# Patient Record
Sex: Female | Born: 1980 | Race: Black or African American | Hispanic: No | Marital: Single | Smoking: Current some day smoker
Health system: Southern US, Community
[De-identification: ages and names within clinical notes are randomized; demographics above are authoritative.]

## PROBLEM LIST (undated history)

## (undated) DIAGNOSIS — I10 Essential (primary) hypertension: Secondary | ICD-10-CM

## (undated) DIAGNOSIS — R87629 Unspecified abnormal cytological findings in specimens from vagina: Secondary | ICD-10-CM

## (undated) DIAGNOSIS — E669 Obesity, unspecified: Secondary | ICD-10-CM

## (undated) DIAGNOSIS — A599 Trichomoniasis, unspecified: Secondary | ICD-10-CM

## (undated) HISTORY — DX: Unspecified abnormal cytological findings in specimens from vagina: R87.629

## (undated) HISTORY — DX: Essential (primary) hypertension: I10

## (undated) HISTORY — PX: NO PAST SURGERIES: SHX2092

---

## 2017-06-28 ENCOUNTER — Encounter: Payer: Self-pay | Admitting: *Deleted

## 2017-07-11 ENCOUNTER — Encounter: Payer: Self-pay | Admitting: Family Medicine

## 2017-07-11 ENCOUNTER — Ambulatory Visit (INDEPENDENT_AMBULATORY_CARE_PROVIDER_SITE_OTHER): Payer: Self-pay | Admitting: Family Medicine

## 2017-07-11 VITALS — BP 156/95 | HR 82 | Ht 68.0 in | Wt 292.7 lb

## 2017-07-11 DIAGNOSIS — O10919 Unspecified pre-existing hypertension complicating pregnancy, unspecified trimester: Secondary | ICD-10-CM | POA: Insufficient documentation

## 2017-07-11 DIAGNOSIS — Z113 Encounter for screening for infections with a predominantly sexual mode of transmission: Secondary | ICD-10-CM

## 2017-07-11 DIAGNOSIS — O10912 Unspecified pre-existing hypertension complicating pregnancy, second trimester: Secondary | ICD-10-CM

## 2017-07-11 DIAGNOSIS — Z3480 Encounter for supervision of other normal pregnancy, unspecified trimester: Secondary | ICD-10-CM

## 2017-07-11 DIAGNOSIS — O0990 Supervision of high risk pregnancy, unspecified, unspecified trimester: Secondary | ICD-10-CM | POA: Insufficient documentation

## 2017-07-11 DIAGNOSIS — Z3482 Encounter for supervision of other normal pregnancy, second trimester: Secondary | ICD-10-CM

## 2017-07-11 DIAGNOSIS — O09522 Supervision of elderly multigravida, second trimester: Secondary | ICD-10-CM

## 2017-07-11 DIAGNOSIS — F329 Major depressive disorder, single episode, unspecified: Secondary | ICD-10-CM

## 2017-07-11 DIAGNOSIS — O09529 Supervision of elderly multigravida, unspecified trimester: Secondary | ICD-10-CM | POA: Insufficient documentation

## 2017-07-11 DIAGNOSIS — Z124 Encounter for screening for malignant neoplasm of cervix: Secondary | ICD-10-CM

## 2017-07-11 DIAGNOSIS — F32A Depression, unspecified: Secondary | ICD-10-CM

## 2017-07-11 DIAGNOSIS — Z1151 Encounter for screening for human papillomavirus (HPV): Secondary | ICD-10-CM

## 2017-07-11 LAB — POCT URINALYSIS DIP (DEVICE)
Glucose, UA: NEGATIVE mg/dL
Hgb urine dipstick: NEGATIVE
Nitrite: NEGATIVE
Protein, ur: 30 mg/dL — AB
Specific Gravity, Urine: >=1.03 (ref 1.005–1.030)
Urobilinogen, UA: 0.2 mg/dL (ref 0.0–1.0)
pH: 6 (ref 5.0–8.0)

## 2017-07-11 MED ORDER — ASPIRIN 81 MG PO TABS: 81.0000 mg | ORAL_TABLET | Freq: Every day | ORAL | 6 refills | Status: DC

## 2017-07-11 NOTE — Progress Notes (Signed)
  Subjective:    Felicia HarrisJennifer Brennan is a G2P0010 7443w0d being seen today for her first obstetrical visit.  Her obstetrical history is significant for advanced maternal age and chronic HTN. Patient does intend to breast feed. Pregnancy history fully reviewed.  Patient reports no complaints.  Vitals:   07/11/17 0849 07/11/17 0852  BP: (!) 156/95   Pulse: 82   Weight: 292 lb 11.2 oz (132.8 kg)   Height:  5\' 8"  (1.727 m)    HISTORY: OB History  Gravida Para Term Preterm AB Living  2       1    SAB TAB Ectopic Multiple Live Births  1            # Outcome Date GA Lbr Len/2nd Weight Sex Delivery Anes PTL Lv  2 Current           1 SAB 07/16/12       N      Past Medical History:  Diagnosis Date  . Hypertension    History reviewed. No pertinent surgical history. Family History  Problem Relation Age of Onset  . Hypertension Mother   . Pancreatic cancer Mother   . COPD Father      Exam   Physical Exam  Constitutional: She is oriented to person, place, and time. She appears well-developed and well-nourished.  HENT:  Head: Normocephalic and atraumatic.  Eyes: EOM are normal. Pupils are equal, round, and reactive to light.  Neck: Normal range of motion.  Cardiovascular: Normal rate and intact distal pulses.  Pulmonary/Chest: Effort normal. No respiratory distress.  Abdominal: Soft. There is no tenderness.  Musculoskeletal: Normal range of motion. She exhibits no edema.  Neurological: She is alert and oriented to person, place, and time.  Skin: Skin is warm and dry.  Psychiatric: She has a normal mood and affect. Her behavior is normal.      Assessment:    Pregnancy: G2P0010 Patient Active Problem List   Diagnosis Date Noted  . Supervision of other normal pregnancy, antepartum 07/11/2017  . Chronic hypertension during pregnancy, antepartum 07/11/2017  . Antepartum multigravida of advanced maternal age 35/26/2018        Plan:     Initial labs drawn. Add pr/cr  ratio and CMP for baseline labs for chronic HTN. Prenatal vitamins. Problem list reviewed and updated. Genetic Screening discussed Quad Screen: ordered.  Ultrasound discussed; fetal survey: ordered.  Follow up in 4 weeks.   [employer]mber Devika Dragovich 07/11/2017

## 2017-07-11 NOTE — Addendum Note (Signed)
Addended by: Lorelle GibbsWILSON, CHIQUITA L on: 07/11/2017 03:03 PM   Modules accepted: Orders

## 2017-07-11 NOTE — Addendum Note (Signed)
Addended by: Mikey BussingWILSON, CHIQUITA L on: 07/11/2017 09:16 AM   Modules accepted: Orders

## 2017-07-11 NOTE — Patient Instructions (Signed)
Eating Plan for Pregnant Women While you are pregnant, your body will require additional nutrition to help support your growing baby. It is recommended that you consume:  150 additional calories each day during your first trimester.  300 additional calories each day during your second trimester.  300 additional calories each day during your third trimester.  Eating a healthy, well-balanced diet is very important for your health and for your baby's health. You also have a higher need for some vitamins and minerals, such as folic acid, calcium, iron, and vitamin D. What do I need to know about eating during pregnancy?  Do not try to lose weight or go on a diet during pregnancy.  Choose healthy, nutritious foods. Choose  of a sandwich with a glass of milk instead of a candy bar or a high-calorie sugar-sweetened beverage.  Limit your overall intake of foods that have "empty calories." These are foods that have little nutritional value, such as sweets, desserts, candies, sugar-sweetened beverages, and fried foods.  Eat a variety of foods, especially fruits and vegetables.  Take a prenatal vitamin to help meet the additional needs during pregnancy, specifically for folic acid, iron, calcium, and vitamin D.  Remember to stay active. Ask your health care provider for exercise recommendations that are specific to you.  Practice good food safety and cleanliness, such as washing your hands before you eat and after you prepare raw meat. This helps to prevent foodborne illnesses, such as listeriosis, that can be very dangerous for your baby. Ask your health care provider for more information about listeriosis. What does 150 extra calories look like? Healthy options for an additional 150 calories each day could be any of the following:  Plain low-fat yogurt (6-8 oz) with  cup of berries.  1 apple with 2 teaspoons of peanut butter.  Cut-up vegetables with  cup of hummus.  Low-fat chocolate  milk (8 oz or 1 cup).  1 string cheese with 1 medium orange.   of a peanut butter and jelly sandwich on whole-wheat bread (1 tsp of peanut butter).  For 300 calories, you could eat two of those healthy options each day. What is a healthy amount of weight to gain? The recommended amount of weight for you to gain is based on your pre-pregnancy BMI. If your pre-pregnancy BMI was:  Less than 18 (underweight), you should gain 28-40 lb.  18-24.9 (normal), you should gain 25-35 lb.  25-29.9 (overweight), you should gain 15-25 lb.  Greater than 30 (obese), you should gain 11-20 lb.  What if I am having twins or multiples? Generally, pregnant women who will be having twins or multiples may need to increase their daily calories by 300-600 calories each day. The recommended range for total weight gain is 25-54 lb, depending on your pre-pregnancy BMI. Talk with your health care provider for specific guidance about additional nutritional needs, weight gain, and exercise during your pregnancy. What foods can I eat? Grains Any grains. Try to choose whole grains, such as whole-wheat bread, oatmeal, or brown rice. Vegetables Any vegetables. Try to eat a variety of colors and types of vegetables to get a full range of vitamins and minerals. Remember to wash your vegetables well before eating. Fruits Any fruits. Try to eat a variety of colors and types of fruit to get a full range of vitamins and minerals. Remember to wash your fruits well before eating. Meats and Other Protein Sources Lean meats, including chicken, turkey, fish, and lean cuts of beef, veal,   or pork. Make sure that all meats are cooked to "well done." Tofu. Tempeh. Beans. Eggs. Peanut butter and other nut butters. Seafood, such as shrimp, crab, and lobster. If you choose fish, select types that are higher in omega-3 fatty acids, including salmon, herring, mussels, trout, sardines, and pollock. Make sure that all meats are cooked to  food-safe temperatures. Dairy Pasteurized milk and milk alternatives. Pasteurized yogurt and pasteurized cheese. Cottage cheese. Sour cream. Beverages Water. Juices that contain 100% fruit juice or vegetable juice. Caffeine-free teas and decaffeinated coffee. Drinks that contain caffeine are okay to drink, but it is better to avoid caffeine. Keep your total caffeine intake to less than 200 mg each day (12 oz of coffee, tea, or soda) or as directed by your health care provider. Condiments Any pasteurized condiments. Sweets and Desserts Any sweets and desserts. Fats and Oils Any fats and oils. The items listed above may not be a complete list of recommended foods or beverages. Contact your dietitian for more options. What foods are not recommended? Vegetables Unpasteurized (raw) vegetable juices. Fruits Unpasteurized (raw) fruit juices. Meats and Other Protein Sources Cured meats that have nitrates, such as bacon, salami, and hotdogs. Luncheon meats, bologna, or other deli meats (unless they are reheated until they are steaming hot). Refrigerated pate, meat spreads from a meat counter, smoked seafood that is found in the refrigerated section of a store. Raw fish, such as sushi or sashimi. High mercury content fish, such as tilefish, shark, swordfish, and king mackerel. Raw meats, such as tuna or beef tartare. Undercooked meats and poultry. Make sure that all meats are cooked to food-safe temperatures. Dairy Unpasteurized (raw) milk and any foods that have raw milk in them. Soft cheeses, such as feta, queso blanco, queso fresco, Brie, Camembert cheeses, blue-veined cheeses, and Panela cheese (unless it is made with pasteurized milk, which must be stated on the label). Beverages Alcohol. Sugar-sweetened beverages, such as sodas, teas, or energy drinks. Condiments Homemade fermented foods and drinks, such as pickles, sauerkraut, or kombucha drinks. (Store-bought pasteurized versions of these are  okay.) Other Salads that are made in the store, such as ham salad, chicken salad, egg salad, tuna salad, and seafood salad. The items listed above may not be a complete list of foods and beverages to avoid. Contact your dietitian for more information. This information is not intended to replace advice given to you by your health care provider. Make sure you discuss any questions you have with your health care provider. Document Released: 04/17/2014 Document Revised: 12/09/2015 Document Reviewed: 12/16/2013 Elsevier Interactive Patient Education  2018 Elsevier Inc.   

## 2017-07-12 LAB — CYTOLOGY - PAP
CHLAMYDIA, DNA PROBE: NEGATIVE
DIAGNOSIS: NEGATIVE
HPV: DETECTED — AB
Neisseria Gonorrhea: NEGATIVE

## 2017-07-13 LAB — URINE CULTURE, OB REFLEX

## 2017-07-13 LAB — CULTURE, OB URINE

## 2017-07-25 ENCOUNTER — Encounter: Payer: Self-pay | Admitting: Family Medicine

## 2017-07-25 DIAGNOSIS — A5901 Trichomonal vulvovaginitis: Secondary | ICD-10-CM

## 2017-07-25 DIAGNOSIS — R87619 Unspecified abnormal cytological findings in specimens from cervix uteri: Secondary | ICD-10-CM | POA: Insufficient documentation

## 2017-07-25 DIAGNOSIS — O23599 Infection of other part of genital tract in pregnancy, unspecified trimester: Secondary | ICD-10-CM

## 2017-07-25 DIAGNOSIS — Z3480 Encounter for supervision of other normal pregnancy, unspecified trimester: Secondary | ICD-10-CM

## 2017-07-25 MED ORDER — METRONIDAZOLE 500 MG PO TABS
500.0000 mg | ORAL_TABLET | Freq: Two times a day (BID) | ORAL | 0 refills | Status: DC
Start: 2017-07-25 — End: 2017-11-27

## 2017-07-25 NOTE — Progress Notes (Signed)
Reviewing record for practice. Noted several follow up items 1) Trich on pap-- needs treatment, sent in rx and routed message to clinic to call pt. Partner needs treatment as well.  2) abnormal pap-- updated problem list 3) noted elevated BP and sent message to provider regarding starting antihypertensive

## 2017-07-27 ENCOUNTER — Telehealth: Payer: Self-pay | Admitting: *Deleted

## 2017-07-27 DIAGNOSIS — O280 Abnormal hematological finding on antenatal screening of mother: Secondary | ICD-10-CM | POA: Insufficient documentation

## 2017-07-27 NOTE — Telephone Encounter (Addendum)
Called pt to discuss AFP test results and additional appt which has been made. Heard message stating that the mailbox is full and cannot accept messages. **Pt needs to be informed of abnormal AFP results showing increased risk for Down's Syndrome and Trisomy 18. Genetic Counseling appt has been scheduled on same [place] as her anatomy US. She should arrive @ 0830 and plan for appts to last 2-2.5 hrs.  Pt also needs to be informed of +trich, Rx called in to pharmacy and her partner requires treatment. In addition, her Pap is abnormal and she will need Colposcopy post partum.   1/24  1658  Per chart review, pt was seen @ office yesterday by Dr. Earlene Plateravis and was informed of the above information.

## 2017-08-08 ENCOUNTER — Encounter (HOSPITAL_COMMUNITY): Payer: Self-pay | Admitting: Family Medicine

## 2017-08-08 ENCOUNTER — Encounter: Payer: Self-pay | Admitting: Obstetrics and Gynecology

## 2017-08-08 ENCOUNTER — Telehealth: Payer: Self-pay

## 2017-08-08 ENCOUNTER — Ambulatory Visit: Payer: Self-pay | Admitting: Clinical

## 2017-08-08 ENCOUNTER — Ambulatory Visit (INDEPENDENT_AMBULATORY_CARE_PROVIDER_SITE_OTHER): Payer: Self-pay | Admitting: Obstetrics and Gynecology

## 2017-08-08 VITALS — BP 140/76 | HR 80 | Wt 302.9 lb

## 2017-08-08 DIAGNOSIS — O23599 Infection of other part of genital tract in pregnancy, unspecified trimester: Secondary | ICD-10-CM

## 2017-08-08 DIAGNOSIS — O23592 Infection of other part of genital tract in pregnancy, second trimester: Secondary | ICD-10-CM

## 2017-08-08 DIAGNOSIS — O10919 Unspecified pre-existing hypertension complicating pregnancy, unspecified trimester: Secondary | ICD-10-CM

## 2017-08-08 DIAGNOSIS — Z3480 Encounter for supervision of other normal pregnancy, unspecified trimester: Secondary | ICD-10-CM

## 2017-08-08 DIAGNOSIS — A5901 Trichomonal vulvovaginitis: Secondary | ICD-10-CM

## 2017-08-08 DIAGNOSIS — R87619 Unspecified abnormal cytological findings in specimens from cervix uteri: Secondary | ICD-10-CM

## 2017-08-08 DIAGNOSIS — O280 Abnormal hematological finding on antenatal screening of mother: Secondary | ICD-10-CM

## 2017-08-08 DIAGNOSIS — O09529 Supervision of elderly multigravida, unspecified trimester: Secondary | ICD-10-CM

## 2017-08-08 DIAGNOSIS — O10912 Unspecified pre-existing hypertension complicating pregnancy, second trimester: Secondary | ICD-10-CM

## 2017-08-08 DIAGNOSIS — O09522 Supervision of elderly multigravida, second trimester: Secondary | ICD-10-CM

## 2017-08-08 MED ORDER — PRENATAL VITAMINS 0.8 MG PO TABS: 1.0000 | ORAL_TABLET | Freq: Every day | ORAL | 12 refills | Status: DC

## 2017-08-08 NOTE — BH Specialist Note (Signed)
error 

## 2017-08-08 NOTE — Telephone Encounter (Signed)
Clld Pt to advise of AFP results & appts,+Tric,No answer,VM full.

## 2017-08-08 NOTE — Progress Notes (Addendum)
   PRENATAL VISIT NOTE  Subjective:  Felicia Brennan is a 37 y.o. G2P0010 at 4141w0d being seen today for ongoing prenatal care.  She is currently monitored for the following issues for this high-risk pregnancy and has Supervision of other normal pregnancy, antepartum; Chronic hypertension during pregnancy, antepartum; Antepartum multigravida of advanced maternal age; Trichomonal vaginitis in pregnancy; Abnormal Pap smear of cervix; and Abnormal quad screen on their problem list.  Patient reports no complaints.  Contractions: Not present. Vag. Bleeding: None.  Movement: Absent. Denies leaking of fluid.   The following portions of the patient's history were reviewed and updated as appropriate: allergies, current medications, past family history, past medical history, past social history, past surgical history and problem list. Problem list updated.  Objective:   Vitals:   08/08/17 1324  BP: 140/76  Pulse: 80  Weight: (!) 302 lb 14.4 oz (137.4 kg)    Fetal Status: Fetal Heart Rate (bpm): 150   Movement: Absent     General:  Alert, oriented and cooperative. Patient is in no acute distress.  Skin: Skin is warm and dry. No rash noted.   Cardiovascular: Normal heart rate noted  Respiratory: Normal respiratory effort, no problems with respiration noted  Abdomen: Soft, gravid, appropriate for gestational age.  Pain/Pressure: Absent     Pelvic: Cervical exam deferred        Extremities: Normal range of motion.  Edema: None  Mental Status:  Normal mood and affect. Normal behavior. Normal judgment and thought content.   Assessment and Plan:  Pregnancy: G2P0010 at 3041w0d  1. Chronic hypertension during pregnancy, antepartum On meds a long time ago, not recently Stable today cont baby ASA  2. Abnormal cervical Papanicolaou smear, unspecified abnormal pap finding Needs colpo pp  3. Supervision of other normal pregnancy, antepartum Anatomy scheduled for 08/14/17  4. Trichomonal vaginitis  during pregnancy, antepartum Informed of positive test today, script previously sent to pharmacy Emphasized need for partner treatment  5. Abnormal quad screen + for DS & T18 Informed patient today of positive screening test, briefly discussed amniocentesis and NIPS, she declines NIPS today, likely does not want amnio, will consider and discuss at genetic counseling appt Scheduled for genetic counseling same day as anatomy (1/29) Answered all questions [facility], would like to see after her genetic counseling appt, has been scheduled for same  6. Antepartum multigravida of advanced maternal age   Preterm labor symptoms and general obstetric precautions including but not limited to vaginal bleeding, contractions, leaking of fluid and fetal movement were reviewed in detail with the patient. Please refer to After Visit Summary for other counseling recommendations.  Return in about 3 weeks (around 08/29/2017) for OB visit (MD).   Conan BowensKelly M Ted Goodner, MD

## 2017-08-14 ENCOUNTER — Encounter (HOSPITAL_COMMUNITY): Payer: Self-pay

## 2017-08-14 ENCOUNTER — Ambulatory Visit (HOSPITAL_COMMUNITY)
Admission: RE | Admit: 2017-08-14 | Discharge: 2017-08-14 | Disposition: A | Discharge status: Home / Self Care | Payer: PRIVATE HEALTH INSURANCE | Source: Ambulatory Visit | Attending: Family Medicine | Admitting: Family Medicine

## 2017-08-14 ENCOUNTER — Ambulatory Visit: Payer: Self-pay

## 2017-08-14 ENCOUNTER — Other Ambulatory Visit (HOSPITAL_COMMUNITY): Payer: Self-pay | Admitting: *Deleted

## 2017-08-14 ENCOUNTER — Other Ambulatory Visit: Payer: Self-pay | Admitting: Family Medicine

## 2017-08-14 DIAGNOSIS — Z6841 Body Mass Index (BMI) 40.0 and over, adult: Secondary | ICD-10-CM | POA: Insufficient documentation

## 2017-08-14 DIAGNOSIS — O289 Unspecified abnormal findings on antenatal screening of mother: Secondary | ICD-10-CM | POA: Diagnosis present

## 2017-08-14 DIAGNOSIS — O09529 Supervision of elderly multigravida, unspecified trimester: Secondary | ICD-10-CM

## 2017-08-14 DIAGNOSIS — O281 Abnormal biochemical finding on antenatal screening of mother: Secondary | ICD-10-CM

## 2017-08-14 DIAGNOSIS — Z362 Encounter for other antenatal screening follow-up: Secondary | ICD-10-CM

## 2017-08-14 DIAGNOSIS — Z3A18 18 weeks gestation of pregnancy: Secondary | ICD-10-CM | POA: Diagnosis not present

## 2017-08-14 DIAGNOSIS — O10019 Pre-existing essential hypertension complicating pregnancy, unspecified trimester: Secondary | ICD-10-CM | POA: Diagnosis not present

## 2017-08-14 DIAGNOSIS — Z3A19 19 weeks gestation of pregnancy: Secondary | ICD-10-CM

## 2017-08-14 DIAGNOSIS — O99212 Obesity complicating pregnancy, second trimester: Secondary | ICD-10-CM | POA: Diagnosis not present

## 2017-08-14 DIAGNOSIS — O10919 Unspecified pre-existing hypertension complicating pregnancy, unspecified trimester: Secondary | ICD-10-CM

## 2017-08-14 DIAGNOSIS — O09512 Supervision of elderly primigravida, second trimester: Secondary | ICD-10-CM | POA: Insufficient documentation

## 2017-08-14 LAB — AFP TETRA
DIA VALUE (EIA): 244.58 pg/mL
DSR (BY AGE) 1 IN: 217
Gestational Age: 13.6 WEEKS
MSAFP: 9.9 ng/mL
MSHCG: 30601 m[IU]/mL
Maternal Age At EDD: 36.4 yr
Weight: 292 [lb_av]
uE3 Value: 0.14 ng/mL

## 2017-08-14 LAB — COMPREHENSIVE METABOLIC PANEL
A/G RATIO: 1.3 (ref 1.2–2.2)
ALT: 5 IU/L (ref 0–32)
AST: 9 IU/L (ref 0–40)
Albumin: 3.9 g/dL (ref 3.5–5.5)
Alkaline Phosphatase: 50 IU/L (ref 39–117)
BUN/Creatinine Ratio: 17 (ref 9–23)
BUN: 9 mg/dL (ref 6–20)
Bilirubin Total: <0.2 mg/dL (ref 0.0–1.2)
CALCIUM: 9.6 mg/dL (ref 8.7–10.2)
CO2: 19 mmol/L — AB (ref 20–29)
CREATININE: 0.54 mg/dL — AB (ref 0.57–1.00)
Chloride: 103 mmol/L (ref 96–106)
GFR, EST AFRICAN AMERICAN: 141 mL/min/{1.73_m2} (ref >59)
GFR, EST NON AFRICAN AMERICAN: 123 mL/min/{1.73_m2} (ref >59)
GLOBULIN, TOTAL: 3.1 g/dL (ref 1.5–4.5)
Glucose: 84 mg/dL (ref 65–99)
Potassium: 4.1 mmol/L (ref 3.5–5.2)
SODIUM: 137 mmol/L (ref 134–144)
TOTAL PROTEIN: 7 g/dL (ref 6.0–8.5)

## 2017-08-14 LAB — OBSTETRIC PANEL, INCLUDING HIV
Antibody Screen: NEGATIVE
BASOS ABS: 0 10*3/uL (ref 0.0–0.2)
Basos: 0 %
EOS (ABSOLUTE): 0.1 10*3/uL (ref 0.0–0.4)
Eos: 1 %
HIV SCREEN 4TH GENERATION: NONREACTIVE
Hematocrit: 36.5 % (ref 34.0–46.6)
Hemoglobin: 12 g/dL (ref 11.1–15.9)
Hepatitis B Surface Ag: NEGATIVE
Immature Grans (Abs): 0 10*3/uL (ref 0.0–0.1)
Immature Granulocytes: 0 %
LYMPHS ABS: 2.6 10*3/uL (ref 0.7–3.1)
Lymphs: 27 %
MCH: 30 pg (ref 26.6–33.0)
MCHC: 32.9 g/dL (ref 31.5–35.7)
MCV: 91 fL (ref 79–97)
MONOS ABS: 0.6 10*3/uL (ref 0.1–0.9)
Monocytes: 6 %
NEUTROS ABS: 6.6 10*3/uL (ref 1.4–7.0)
Neutrophils: 66 %
PLATELETS: 328 10*3/uL (ref 150–379)
RBC: 4 x10E6/uL (ref 3.77–5.28)
RDW: 13.5 % (ref 12.3–15.4)
RPR: NONREACTIVE
Rh Factor: POSITIVE
Rubella Antibodies, IGG: 1.03 index (ref >0.99)
WBC: 10 10*3/uL (ref 3.4–10.8)

## 2017-08-14 LAB — HEMOGLOBINOPATHY EVALUATION
HEMOGLOBIN A2 QUANTITATION: 2.2 % (ref 1.8–3.2)
HGB A: 97.8 % (ref 96.4–98.8)
HGB C: 0 %
HGB S: 0 %
HGB VARIANT: 0 %
Hemoglobin F Quantitation: 0 % (ref 0.0–2.0)

## 2017-08-14 LAB — HEMOGLOBIN A1C
Est. average glucose Bld gHb Est-mCnc: 97 mg/dL
Hgb A1c MFr Bld: 5 % (ref 4.8–5.6)

## 2017-08-14 LAB — PROTEIN / CREATININE RATIO, URINE
Creatinine, Urine: 320.7 mg/dL
PROTEIN UR: 44.6 mg/dL
PROTEIN/CREAT RATIO: 139 mg/g{creat} (ref 0–200)

## 2017-08-14 NOTE — Progress Notes (Signed)
Genetic Counseling  High-Risk Gestation Note  Appointment Date:  08/14/2017 Referred By: Rolm Bookbinder, DO Date of Birth:  Aug 04, 1980   Pregnancy History: G2P0010 Estimated Date of Delivery: 01/12/18 Estimated Gestational Age: [redacted]w[redacted]d Attending: Charlsie Merles, MD    Felicia Brennan was seen for genetic counseling regarding a maternal age of 37 y.o. and an increased risk for Down syndrome and Trisomy 18 based on Quad Brennan through [facility].   In summary:  Discussed AMA and associated risk for fetal aneuploidy  Reviewed results of Quad screening  Down syndrome risk  Trisomy 18 risk  Ultrasound performed today visualized pregnancy to be [redacted]w[redacted]d- see separate ultrasound report for complete information  If EDC is changed based on today's ultrasound measurements, then Quad Brennan was drawn too early in gestation for accurate interpretation and is invalid  Discussed options for additional screening  Repeat Quad- declined  NIPS- declined today; may consider pending results of follow-up ultrasound on 09/11/17  Ultrasound- performed today; see separate report  Discussed diagnostic testing options  Amniocentesis- declined  Reviewed family history concerns  Discussed carrier screening options- declined  CF  SMA  Hemoglobinopathies  She was counseled regarding maternal age and the association with risk for chromosome conditions due to nondisjunction with aging of the ova.   We reviewed chromosomes, nondisjunction, and the associated 1 in 111 risk for fetal aneuploidy related to a maternal age of 37 y.o. at the current gestation.  They were counseled that the risk for aneuploidy decreases as gestational age increases, accounting for those pregnancies which spontaneously abort.  We specifically discussed Down syndrome (trisomy 67), trisomies 63 and 80, and sex chromosome aneuploidies (47,XXX and 47,XXY) including the common features and prognoses of each.   We also reviewed Felicia Brennan result and the associated increase in risk for fetal Down syndrome and fetal Trisomy 18.  She was counseled regarding other explanations for a Brennan positive result including gestational dating error, normal variation and differences in maternal metabolism. She understands that Quad screening provides a pregnancy specific risk for these conditions, but is not considered to be diagnostic.    Ultrasound performed today visualized the pregnancy to be [redacted]w[redacted]d gestation, with an EDC of 01/12/18. See separate ultrasound report for detailed discussion. We discussed that using the gestational dating from today's ultrasound would indicate that the Quad Brennan was drawn at [redacted]w[redacted]d, which is too early in gestation for accurate interpretation. Limited ultrasound today was within normal limits. Follow-up ultrasound is scheduled for 09/11/17.    We reviewed available screening options including repeat Quad Brennan with updated EDC, noninvasive prenatal screening (NIPS)/cell free DNA (cfDNA) screening, and detailed ultrasound.  She was counseled that screening tests are used to modify a patient's a priori risk for aneuploidy, typically based on age. This estimate provides a pregnancy specific risk assessment. We reviewed the benefits and limitations of each option. Specifically, we discussed the conditions for which each test screens, the detection rates, and false positive rates of each. She was also counseled regarding diagnostic testing via amniocentesis. We reviewed the approximate 1 in 300-500 risk for complications from amniocentesis, including spontaneous pregnancy loss. We discussed the possible results that the tests might provide including: positive, negative, unanticipated, and no result. Finally, they were counseled regarding the cost of each option and potential out of pocket expenses. After consideration of all the options, she elected for ultrasound only today. She declined repeat  Quad Brennan and declined amniocentesis. She declined NIPS today but  stated that she planned to consider this option further and may possibly consider NIPS pending results of her follow-up ultrasound. She understands that ultrasound cannot diagnose or rule out chromosome conditions and that ultrasound does not diagnose or rule out all birth defects or genetic syndromes.    Felicia Brennan was provided with written information regarding cystic fibrosis (CF), spinal muscular atrophy (SMA) and hemoglobinopathies including the carrier frequency, availability of carrier screening and prenatal diagnosis if indicated.  In addition, we discussed that CF and hemoglobinopathies are routinely screened for as part of the [place] newborn screening panel.  She declined screening for CF, SMA and hemoglobinopathies.   Both family histories were reviewed and found to be noncontributory for birth defects, intellectual disability, and known genetic conditions. Consanguinity was denied. Without further information regarding the provided family history, an accurate genetic risk cannot be calculated. Further genetic counseling is warranted if more information is obtained.  Felicia Brennan denied exposure to environmental toxins or chemical agents. She denied the use of alcohol or street drugs. She reported smoking cigarettes during the current pregnancy. The associations of smoking in pregnancy were reviewed and cessation encouraged. She denied significant viral illnesses during the course of her pregnancy.   I counseled this couple regarding the above risks and available options.  The approximate face-to-face time with the genetic counselor was 40 minutes.    Felicia PlowmanKaren Amea Mcphail, MS,  Certified Genetic Counselor 08/14/2017

## 2017-08-15 ENCOUNTER — Ambulatory Visit (HOSPITAL_COMMUNITY): Payer: Self-pay

## 2017-08-29 ENCOUNTER — Encounter: Payer: Self-pay | Admitting: Obstetrics and Gynecology

## 2017-09-11 ENCOUNTER — Ambulatory Visit: Payer: Self-pay

## 2017-09-11 ENCOUNTER — Other Ambulatory Visit (HOSPITAL_COMMUNITY): Payer: Self-pay | Admitting: *Deleted

## 2017-09-11 ENCOUNTER — Other Ambulatory Visit (HOSPITAL_COMMUNITY): Payer: Self-pay | Admitting: Obstetrics and Gynecology

## 2017-09-11 ENCOUNTER — Ambulatory Visit (HOSPITAL_COMMUNITY)
Admission: RE | Admit: 2017-09-11 | Discharge: 2017-09-11 | Disposition: A | Discharge status: Home / Self Care | Payer: PRIVATE HEALTH INSURANCE | Source: Ambulatory Visit | Attending: Family Medicine | Admitting: Family Medicine

## 2017-09-11 ENCOUNTER — Encounter (HOSPITAL_COMMUNITY): Payer: Self-pay

## 2017-09-11 DIAGNOSIS — Z3A22 22 weeks gestation of pregnancy: Secondary | ICD-10-CM

## 2017-09-11 DIAGNOSIS — O99212 Obesity complicating pregnancy, second trimester: Secondary | ICD-10-CM | POA: Insufficient documentation

## 2017-09-11 DIAGNOSIS — O10012 Pre-existing essential hypertension complicating pregnancy, second trimester: Secondary | ICD-10-CM | POA: Insufficient documentation

## 2017-09-11 DIAGNOSIS — Z362 Encounter for other antenatal screening follow-up: Secondary | ICD-10-CM | POA: Diagnosis present

## 2017-09-11 DIAGNOSIS — Z0489 Encounter for examination and observation for other specified reasons: Secondary | ICD-10-CM

## 2017-09-11 DIAGNOSIS — O10019 Pre-existing essential hypertension complicating pregnancy, unspecified trimester: Secondary | ICD-10-CM

## 2017-09-11 DIAGNOSIS — IMO0002 Reserved for concepts with insufficient information to code with codable children: Secondary | ICD-10-CM

## 2017-09-11 DIAGNOSIS — E669 Obesity, unspecified: Secondary | ICD-10-CM | POA: Diagnosis not present

## 2017-09-11 DIAGNOSIS — O10919 Unspecified pre-existing hypertension complicating pregnancy, unspecified trimester: Secondary | ICD-10-CM

## 2017-09-11 DIAGNOSIS — O09512 Supervision of elderly primigravida, second trimester: Secondary | ICD-10-CM | POA: Diagnosis not present

## 2017-10-23 ENCOUNTER — Other Ambulatory Visit (HOSPITAL_COMMUNITY): Payer: Self-pay | Admitting: Maternal and Fetal Medicine

## 2017-10-23 ENCOUNTER — Ambulatory Visit (HOSPITAL_COMMUNITY)
Admission: RE | Admit: 2017-10-23 | Discharge: 2017-10-23 | Disposition: A | Discharge status: Home / Self Care | Payer: Medicaid Other | Source: Ambulatory Visit | Attending: Family Medicine | Admitting: Family Medicine

## 2017-10-23 ENCOUNTER — Other Ambulatory Visit (HOSPITAL_COMMUNITY): Payer: Self-pay | Admitting: *Deleted

## 2017-10-23 ENCOUNTER — Encounter (HOSPITAL_COMMUNITY): Payer: Self-pay

## 2017-10-23 DIAGNOSIS — Z3A28 28 weeks gestation of pregnancy: Secondary | ICD-10-CM

## 2017-10-23 DIAGNOSIS — Z363 Encounter for antenatal screening for malformations: Secondary | ICD-10-CM

## 2017-10-23 DIAGNOSIS — O10013 Pre-existing essential hypertension complicating pregnancy, third trimester: Secondary | ICD-10-CM | POA: Diagnosis present

## 2017-10-23 DIAGNOSIS — O09513 Supervision of elderly primigravida, third trimester: Secondary | ICD-10-CM | POA: Diagnosis not present

## 2017-10-23 DIAGNOSIS — O10919 Unspecified pre-existing hypertension complicating pregnancy, unspecified trimester: Secondary | ICD-10-CM

## 2017-10-23 DIAGNOSIS — O23599 Infection of other part of genital tract in pregnancy, unspecified trimester: Secondary | ICD-10-CM

## 2017-10-23 DIAGNOSIS — O09523 Supervision of elderly multigravida, third trimester: Secondary | ICD-10-CM

## 2017-10-23 DIAGNOSIS — A5901 Trichomonal vulvovaginitis: Secondary | ICD-10-CM

## 2017-10-23 DIAGNOSIS — O99213 Obesity complicating pregnancy, third trimester: Secondary | ICD-10-CM | POA: Diagnosis not present

## 2017-10-23 DIAGNOSIS — R87619 Unspecified abnormal cytological findings in specimens from cervix uteri: Secondary | ICD-10-CM

## 2017-11-20 ENCOUNTER — Other Ambulatory Visit (HOSPITAL_COMMUNITY): Payer: Self-pay | Admitting: *Deleted

## 2017-11-20 ENCOUNTER — Encounter (HOSPITAL_COMMUNITY): Payer: Self-pay

## 2017-11-20 ENCOUNTER — Other Ambulatory Visit (HOSPITAL_COMMUNITY): Payer: Self-pay | Admitting: Obstetrics and Gynecology

## 2017-11-20 ENCOUNTER — Ambulatory Visit (HOSPITAL_COMMUNITY)
Admission: RE | Admit: 2017-11-20 | Discharge: 2017-11-20 | Disposition: A | Discharge status: Home / Self Care | Payer: Medicaid Other | Source: Ambulatory Visit | Attending: Family Medicine | Admitting: Family Medicine

## 2017-11-20 DIAGNOSIS — Z3A32 32 weeks gestation of pregnancy: Secondary | ICD-10-CM | POA: Diagnosis not present

## 2017-11-20 DIAGNOSIS — O09513 Supervision of elderly primigravida, third trimester: Secondary | ICD-10-CM

## 2017-11-20 DIAGNOSIS — O99213 Obesity complicating pregnancy, third trimester: Secondary | ICD-10-CM

## 2017-11-20 DIAGNOSIS — O10919 Unspecified pre-existing hypertension complicating pregnancy, unspecified trimester: Secondary | ICD-10-CM

## 2017-11-20 DIAGNOSIS — E669 Obesity, unspecified: Secondary | ICD-10-CM | POA: Diagnosis not present

## 2017-11-20 DIAGNOSIS — O10013 Pre-existing essential hypertension complicating pregnancy, third trimester: Secondary | ICD-10-CM | POA: Diagnosis present

## 2017-11-27 ENCOUNTER — Ambulatory Visit (INDEPENDENT_AMBULATORY_CARE_PROVIDER_SITE_OTHER): Payer: Medicaid Other | Admitting: Obstetrics and Gynecology

## 2017-11-27 ENCOUNTER — Other Ambulatory Visit (HOSPITAL_COMMUNITY)
Admission: RE | Admit: 2017-11-27 | Discharge: 2017-11-27 | Disposition: A | Discharge status: Home / Self Care | Payer: Medicaid Other | Source: Ambulatory Visit | Attending: Obstetrics and Gynecology | Admitting: Obstetrics and Gynecology

## 2017-11-27 VITALS — BP 148/94 | HR 84 | Wt 303.1 lb

## 2017-11-27 DIAGNOSIS — A5901 Trichomonal vulvovaginitis: Secondary | ICD-10-CM | POA: Diagnosis present

## 2017-11-27 DIAGNOSIS — O0990 Supervision of high risk pregnancy, unspecified, unspecified trimester: Secondary | ICD-10-CM

## 2017-11-27 DIAGNOSIS — O23599 Infection of other part of genital tract in pregnancy, unspecified trimester: Secondary | ICD-10-CM | POA: Insufficient documentation

## 2017-11-27 DIAGNOSIS — O10919 Unspecified pre-existing hypertension complicating pregnancy, unspecified trimester: Secondary | ICD-10-CM

## 2017-11-27 MED ORDER — LABETALOL HCL 200 MG PO TABS: 200.0000 mg | ORAL_TABLET | Freq: Two times a day (BID) | ORAL | 1 refills | Status: DC

## 2017-11-27 NOTE — Progress Notes (Signed)
Subjective:  Felicia Brennan is a 37 y.o. G2P0010 at [redacted]w[redacted]d being seen today for ongoing prenatal care.  She is currently monitored for the following issues for this high-risk pregnancy and has Supervision of high risk pregnancy, antepartum; Chronic hypertension during pregnancy, antepartum; Antepartum multigravida of advanced maternal age; Trichomonal vaginitis in pregnancy; Abnormal Pap smear of cervix; and Abnormal quad screen on their problem list.  Patient reports no complaints.  Contractions: Irritability. Vag. Bleeding: None.  Movement: Present. Denies leaking of fluid. Denies headaches, RUQ abdominal pain, vision changes, swelling.   The following portions of the patient's history were reviewed and updated as appropriate: allergies, current medications, past family history, past medical history, past social history, past surgical history and problem list. Problem list updated.  Objective:   Vitals:   11/27/17 0938 11/27/17 0944  BP: (!) 160/95 (!) 148/94  Pulse: (!) 105 84  Weight: (!) 303 lb 1.6 oz (137.5 kg)     Fetal Status: Fetal Heart Rate (bpm): 145 Fundal Height: 35 cm Movement: Present     General:  Alert, oriented and cooperative. Patient is in no acute distress.  Skin: Skin is warm and dry. No rash noted.   Cardiovascular: Normal heart rate noted  Respiratory: Normal respiratory effort, no problems with respiration noted  Abdomen: Soft, gravid, appropriate for gestational age. Pain/Pressure: Present     Pelvic: Vag. Bleeding: None     Cervical exam deferred        Extremities: Normal range of motion.  Edema: None  Mental Status: Normal mood and affect. Normal behavior. Normal judgment and thought content.   Urinalysis:      Assessment and Plan:  Pregnancy: G2P0010 at [redacted]w[redacted]d  1. Supervision of high risk pregnancy, antepartum Doing well. 28wk labs collected today. - CBC - Glucose Tolerance, 2 Hours w/1 Hour - HIV antibody - RPR  2. Chronic hypertension during  pregnancy, antepartum Elevated BPs today. Baseline BPs for her cHTN seem to be between 140-150 systolic and 80-90 diastolyic. Continue ASA until 36 weeks. Will start Labetalol due to persistent elevation in BPs >150/90s. Rx labetalol 200 BID. Asymptomatic today. Counseled on preeclampsia symptoms. PIH labs collected. Receiving antenatal testing; appropriate growth.   3. Trichomonal vaginitis during pregnancy, antepartum Seen on pap smear on 12/26. Patient denies every having been treated. Denies vaginal discharge. Per chart review had Rx for Flagyl. Patient to self swab today for TOC.  Preterm labor symptoms and general obstetric precautions including but not limited to vaginal bleeding, contractions, leaking of fluid and fetal movement were reviewed in detail with the patient. Please refer to After Visit Summary for other counseling recommendations.  Return in about 2 weeks (around 12/11/2017) for ob visit.   Pincus Large, DO

## 2017-11-27 NOTE — Patient Instructions (Addendum)
Continue ASA until 36 weeks Starting a BP medication for your chronic hypertension.    Hypertension During Pregnancy Hypertension is also called high blood pressure. High blood pressure means that the force of your blood moving in your body is too strong. When you are pregnant, this condition should be watched carefully. It can cause problems for you and your baby. Follow these instructions at home: Eating and drinking  Drink enough fluid to keep your pee (urine) clear or pale yellow.  Eat healthy foods that are low in salt (sodium). ? Do not add salt to your food. ? Check labels on foods and drinks to see much salt is in them. Look on the label where you see "Sodium." Lifestyle  Do not use any products that contain nicotine or tobacco, such as cigarettes and e-cigarettes. If you need help quitting, ask your doctor.  Do not use alcohol.  Avoid caffeine.  Avoid stress. Rest and get plenty of sleep. General instructions  Take over-the-counter and prescription medicines only as told by your doctor.  While lying down, lie on your left side. This keeps pressure off your baby.  While sitting or lying down, raise (elevate) your feet. Try putting some pillows under your lower legs.  Exercise regularly. Ask your doctor what kinds of exercise are best for you.  Keep all prenatal and follow-up visits as told by your doctor. This is important. Contact a doctor if:  You have symptoms that your doctor told you to watch for, such as: ? Fever. ? Throwing up (vomiting). ? Headache. Get help right away if:  You have very bad pain in your belly (abdomen).  You are throwing up, and this does not get better with treatment.  You suddenly get swelling in your hands, ankles, or face.  You gain 4 lb (1.8 kg) or more in 1 week.  You get bleeding from your vagina.  You have blood in your pee.  You do not feel your baby moving as much as normal.  You have a change in vision.  You have  muscle twitching or sudden tightening (spasms).  You have trouble breathing.  Your lips or fingernails turn blue. This information is not intended to replace advice given to you by your health care provider. Make sure you discuss any questions you have with your health care provider. Document Released: 08/05/2010 Document Revised: 03/14/2016 Document Reviewed: 03/14/2016 Elsevier Interactive Patient Education  [employer].

## 2017-11-28 LAB — COMPREHENSIVE METABOLIC PANEL
ALT: 6 IU/L (ref 0–32)
AST: 9 IU/L (ref 0–40)
Albumin/Globulin Ratio: 1.1 — ABNORMAL LOW (ref 1.2–2.2)
Albumin: 3.4 g/dL — ABNORMAL LOW (ref 3.5–5.5)
Alkaline Phosphatase: 94 IU/L (ref 39–117)
BUN/Creatinine Ratio: 14 (ref 9–23)
BUN: 7 mg/dL (ref 6–20)
Bilirubin Total: <0.2 mg/dL (ref 0.0–1.2)
CALCIUM: 8.8 mg/dL (ref 8.7–10.2)
CO2: 18 mmol/L — AB (ref 20–29)
CREATININE: 0.5 mg/dL — AB (ref 0.57–1.00)
Chloride: 106 mmol/L (ref 96–106)
GFR calc Af Amer: 144 mL/min/{1.73_m2} (ref >59)
GFR, EST NON AFRICAN AMERICAN: 125 mL/min/{1.73_m2} (ref >59)
GLOBULIN, TOTAL: 3.1 g/dL (ref 1.5–4.5)
GLUCOSE: 106 mg/dL — AB (ref 65–99)
Potassium: 3.8 mmol/L (ref 3.5–5.2)
SODIUM: 136 mmol/L (ref 134–144)
Total Protein: 6.5 g/dL (ref 6.0–8.5)

## 2017-11-28 LAB — CERVICOVAGINAL ANCILLARY ONLY
CHLAMYDIA, DNA PROBE: NEGATIVE
NEISSERIA GONORRHEA: NEGATIVE
TRICH (WINDOWPATH): POSITIVE — AB

## 2017-11-28 LAB — GLUCOSE TOLERANCE, 2 HOURS W/ 1HR
Glucose, 1 hour: 101 mg/dL (ref 65–179)
Glucose, 2 hour: 102 mg/dL (ref 65–152)
Glucose, Fasting: 74 mg/dL (ref 65–91)

## 2017-11-28 LAB — CBC
HEMATOCRIT: 32.5 % — AB (ref 34.0–46.6)
HEMOGLOBIN: 10.7 g/dL — AB (ref 11.1–15.9)
MCH: 29.8 pg (ref 26.6–33.0)
MCHC: 32.9 g/dL (ref 31.5–35.7)
MCV: 91 fL (ref 79–97)
PLATELETS: 274 10*3/uL (ref 150–379)
RBC: 3.59 x10E6/uL — ABNORMAL LOW (ref 3.77–5.28)
RDW: 13.4 % (ref 12.3–15.4)
WBC: 8.8 10*3/uL (ref 3.4–10.8)

## 2017-11-28 LAB — PROTEIN / CREATININE RATIO, URINE
CREATININE, UR: 246.7 mg/dL
PROTEIN/CREAT RATIO: 186 mg/g{creat} (ref 0–200)
Protein, Ur: 45.9 mg/dL

## 2017-11-28 LAB — HIV ANTIBODY (ROUTINE TESTING W REFLEX): HIV SCREEN 4TH GENERATION: NONREACTIVE

## 2017-11-28 LAB — RPR: RPR Ser Ql: NONREACTIVE

## 2017-12-13 ENCOUNTER — Telehealth: Payer: Self-pay | Admitting: General Practice

## 2017-12-13 DIAGNOSIS — A599 Trichomoniasis, unspecified: Secondary | ICD-10-CM

## 2017-12-13 MED ORDER — METRONIDAZOLE 500 MG PO TABS: 2000.0000 mg | ORAL_TABLET | Freq: Once | ORAL | 0 refills | Status: AC

## 2017-12-13 NOTE — Telephone Encounter (Signed)
Per chart review, patient is still + trichomonas. Flagyl sent to pharmacy. Called & informed patient. Discussed importance of partner treatment, medication at pharmacy & abstaining from intercourse. Patient verbalized understanding to all & had no questions.

## 2017-12-17 ENCOUNTER — Ambulatory Visit (INDEPENDENT_AMBULATORY_CARE_PROVIDER_SITE_OTHER): Payer: Medicaid Other | Admitting: Obstetrics and Gynecology

## 2017-12-17 VITALS — BP 133/84 | HR 92 | Wt 306.1 lb

## 2017-12-17 DIAGNOSIS — O10919 Unspecified pre-existing hypertension complicating pregnancy, unspecified trimester: Secondary | ICD-10-CM

## 2017-12-17 DIAGNOSIS — O10913 Unspecified pre-existing hypertension complicating pregnancy, third trimester: Secondary | ICD-10-CM

## 2017-12-17 DIAGNOSIS — R87619 Unspecified abnormal cytological findings in specimens from cervix uteri: Secondary | ICD-10-CM | POA: Diagnosis not present

## 2017-12-17 DIAGNOSIS — O280 Abnormal hematological finding on antenatal screening of mother: Secondary | ICD-10-CM

## 2017-12-17 DIAGNOSIS — O0993 Supervision of high risk pregnancy, unspecified, third trimester: Secondary | ICD-10-CM | POA: Diagnosis present

## 2017-12-17 DIAGNOSIS — O0990 Supervision of high risk pregnancy, unspecified, unspecified trimester: Secondary | ICD-10-CM

## 2017-12-17 LAB — OB RESULTS CONSOLE GBS: GBS: NEGATIVE

## 2017-12-17 NOTE — Progress Notes (Signed)
   PRENATAL VISIT NOTE  Subjective:  Shirl HarrisJennifer Lawson is a 37 y.o. G2P0010 at 373w2d being seen today for ongoing prenatal care.  She is currently monitored for the following issues for this high-risk pregnancy and has Supervision of high risk pregnancy, antepartum; Chronic hypertension during pregnancy, antepartum; Antepartum multigravida of advanced maternal age; Trichomonal vaginitis in pregnancy; Abnormal Pap smear of cervix; and Abnormal quad screen on their problem list.  Patient reports occasional tightening.  Contractions: Irritability. Vag. Bleeding: None.  Movement: Present. Denies leaking of fluid.   The following portions of the patient's history were reviewed and updated as appropriate: allergies, current medications, past family history, past medical history, past social history, past surgical history and problem list. Problem list updated.  Objective:   Vitals:   12/17/17 1340  BP: 133/84  Pulse: 92  Weight: (!) 306 lb 1.6 oz (138.8 kg)    Fetal Status: Fetal Heart Rate (bpm): 154   Movement: Present     General:  Alert, oriented and cooperative. Patient is in no acute distress.  Skin: Skin is warm and dry. No rash noted.   Cardiovascular: Normal heart rate noted  Respiratory: Normal respiratory effort, no problems with respiration noted  Abdomen: Soft, gravid, appropriate for gestational age.  Pain/Pressure: Absent     Pelvic: Cervical exam deferred        Extremities: Normal range of motion.  Edema: Trace  Mental Status: Normal mood and affect. Normal behavior. Normal judgment and thought content.   Assessment and Plan:  Pregnancy: G2P0010 at 8773w2d  1. Supervision of high risk pregnancy, antepartum GBS today  2. Abnormal cervical Papanicolaou smear, unspecified abnormal pap finding colpo pp  3. Chronic hypertension during pregnancy, antepartum Cont labetalol 200 mg BID S/p baby ASA Has not started weekly testing NST reactive BPP tomorrow  4. Abnormal  quad screen Declined further testing, then EDC was changed and AFP likely drawn to early, she declined further testing  Preterm labor symptoms and general obstetric precautions including but not limited to vaginal bleeding, contractions, leaking of fluid and fetal movement were reviewed in detail with the patient. Please refer to After Visit Summary for other counseling recommendations.  Return in about 1 week (around 12/24/2017) for NST/BPP and HOB.  Future Appointments  Date Time Provider Department Center  12/18/2017 10:30 AM [facility] US 1 [facility]-US  12/28/2017 10:15 AM [facility] NST [facility]  12/28/2017 11:15 AM [place] BingPickens, Charlie, MD [facility]  01/03/2018  8:15 AM [facility] NST [facility]  01/03/2018  9:15 AM Reva BoresPratt, Tanya S, MD [facility]  01/09/2018  1:15 PM [facility] NST [facility]  01/09/2018  2:15 PM Allie Bossierove, Myra C, MD [facility]    Conan BowensKelly M Rubbie Goostree, MD

## 2017-12-18 ENCOUNTER — Other Ambulatory Visit (HOSPITAL_COMMUNITY): Payer: Self-pay | Admitting: Maternal and Fetal Medicine

## 2017-12-18 ENCOUNTER — Encounter (HOSPITAL_COMMUNITY): Payer: Self-pay

## 2017-12-18 ENCOUNTER — Ambulatory Visit (HOSPITAL_COMMUNITY)
Admission: RE | Admit: 2017-12-18 | Discharge: 2017-12-18 | Disposition: A | Discharge status: Home / Self Care | Payer: Medicaid Other | Source: Ambulatory Visit | Attending: Obstetrics and Gynecology | Admitting: Obstetrics and Gynecology

## 2017-12-18 DIAGNOSIS — O163 Unspecified maternal hypertension, third trimester: Secondary | ICD-10-CM

## 2017-12-18 DIAGNOSIS — O10919 Unspecified pre-existing hypertension complicating pregnancy, unspecified trimester: Secondary | ICD-10-CM

## 2017-12-18 DIAGNOSIS — O10913 Unspecified pre-existing hypertension complicating pregnancy, third trimester: Secondary | ICD-10-CM | POA: Insufficient documentation

## 2017-12-18 DIAGNOSIS — Z3A36 36 weeks gestation of pregnancy: Secondary | ICD-10-CM | POA: Insufficient documentation

## 2017-12-18 DIAGNOSIS — O99213 Obesity complicating pregnancy, third trimester: Secondary | ICD-10-CM

## 2017-12-18 DIAGNOSIS — Z362 Encounter for other antenatal screening follow-up: Secondary | ICD-10-CM

## 2017-12-21 LAB — CULTURE, BETA STREP (GROUP B ONLY): STREP GP B CULTURE: NEGATIVE

## 2017-12-28 ENCOUNTER — Ambulatory Visit (INDEPENDENT_AMBULATORY_CARE_PROVIDER_SITE_OTHER): Payer: Medicaid Other | Admitting: *Deleted

## 2017-12-28 ENCOUNTER — Encounter (HOSPITAL_COMMUNITY): Payer: Self-pay | Admitting: *Deleted

## 2017-12-28 ENCOUNTER — Ambulatory Visit (INDEPENDENT_AMBULATORY_CARE_PROVIDER_SITE_OTHER): Payer: Medicaid Other | Admitting: Obstetrics and Gynecology

## 2017-12-28 ENCOUNTER — Other Ambulatory Visit (HOSPITAL_COMMUNITY)
Admission: RE | Admit: 2017-12-28 | Discharge: 2017-12-28 | Disposition: A | Discharge status: Home / Self Care | Payer: Medicaid Other | Source: Ambulatory Visit | Attending: Obstetrics and Gynecology | Admitting: Obstetrics and Gynecology

## 2017-12-28 ENCOUNTER — Ambulatory Visit: Payer: Self-pay

## 2017-12-28 ENCOUNTER — Telehealth (HOSPITAL_COMMUNITY): Payer: Self-pay | Admitting: *Deleted

## 2017-12-28 VITALS — BP 143/89 | HR 84 | Wt 311.9 lb

## 2017-12-28 DIAGNOSIS — O23593 Infection of other part of genital tract in pregnancy, third trimester: Secondary | ICD-10-CM

## 2017-12-28 DIAGNOSIS — O0993 Supervision of high risk pregnancy, unspecified, third trimester: Secondary | ICD-10-CM | POA: Insufficient documentation

## 2017-12-28 DIAGNOSIS — Z3A37 37 weeks gestation of pregnancy: Secondary | ICD-10-CM | POA: Insufficient documentation

## 2017-12-28 DIAGNOSIS — O10913 Unspecified pre-existing hypertension complicating pregnancy, third trimester: Secondary | ICD-10-CM | POA: Diagnosis present

## 2017-12-28 DIAGNOSIS — O09523 Supervision of elderly multigravida, third trimester: Secondary | ICD-10-CM

## 2017-12-28 DIAGNOSIS — R87618 Other abnormal cytological findings on specimens from cervix uteri: Secondary | ICD-10-CM

## 2017-12-28 DIAGNOSIS — O0990 Supervision of high risk pregnancy, unspecified, unspecified trimester: Secondary | ICD-10-CM

## 2017-12-28 DIAGNOSIS — O10919 Unspecified pre-existing hypertension complicating pregnancy, unspecified trimester: Secondary | ICD-10-CM

## 2017-12-28 DIAGNOSIS — A5901 Trichomonal vulvovaginitis: Secondary | ICD-10-CM

## 2017-12-28 DIAGNOSIS — O09529 Supervision of elderly multigravida, unspecified trimester: Secondary | ICD-10-CM

## 2017-12-28 NOTE — Progress Notes (Signed)
Prenatal Visit Note Date: 12/28/2017 Clinic: [facility]  Subjective:  Felicia Brennan is a 37 y.o. G2P0010 at 3346w6d being seen today for ongoing prenatal care.  She is currently monitored for the following issues for this high-risk pregnancy and has Supervision of high risk pregnancy, antepartum; Chronic hypertension during pregnancy, antepartum; Antepartum multigravida of advanced maternal age; Trichomonal vaginitis in pregnancy; Abnormal Pap smear of cervix; and Abnormal quad screen on their problem list.  Patient reports no complaints.   Contractions: Not present. Vag. Bleeding: None.  Movement: Present. Denies leaking of fluid.   The following portions of the patient's history were reviewed and updated as appropriate: allergies, current medications, past family history, past medical history, past social history, past surgical history and problem list. Problem list updated.  Objective:   Vitals:   12/28/17 1044  BP: (!) 143/89  Pulse: 84  Weight: (!) 311 lb 14.4 oz (141.5 kg)    Fetal Status: Fetal Heart Rate (bpm): NST   Movement: Present     General:  Alert, oriented and cooperative. Patient is in no acute distress.  Skin: Skin is warm and dry. No rash noted.   Cardiovascular: Normal heart rate noted  Respiratory: Normal respiratory effort, no problems with respiration noted  Abdomen: Soft, gravid, appropriate for gestational age. Pain/Pressure: Absent     Pelvic:  Cervical exam deferred        Extremities: Normal range of motion.     Mental Status: Normal mood and affect. Normal behavior. Normal judgment and thought content.   Urinalysis:      Assessment and Plan:  Pregnancy: G2P0010 at 5446w6d  1. Supervision of high risk pregnancy, antepartum Routine care. D/w pt more re: BC - Cervicovaginal ancillary only  2. Chronic hypertension during pregnancy, antepartum Doing well on labealol 200/200. Pt stopped her low dose asa a few weeks ago. Ceph, bpp  10/10 today. Rpt one week. Set up for iol next week. gbs neg  3. Trichomonal vaginitis during pregnancy in third trimester Pt to try and void today for toc.  - Cervicovaginal ancillary only  4. Antepartum multigravida of advanced maternal age No issues. See PL for abnormal quad  5. Other abnormal cytological finding of specimen from cervix colpo pp  Term labor symptoms and general obstetric precautions including but not limited to vaginal bleeding, contractions, leaking of fluid and fetal movement were reviewed in detail with the patient. Please refer to After Visit Summary for other counseling recommendations.  Return in about 6 days (around 01/03/2018) for as scheduled.   [place] BingPickens, Lendon George, MD

## 2017-12-28 NOTE — Progress Notes (Signed)
Pt refused to fill out PHQ

## 2017-12-28 NOTE — Progress Notes (Signed)
Pt denies H/A or visual disturbances.  IOL scheduled 6/22 @ 0700

## 2017-12-28 NOTE — Progress Notes (Signed)

## 2017-12-28 NOTE — Telephone Encounter (Signed)
Preadmission screen  

## 2017-12-29 LAB — CERVICOVAGINAL ANCILLARY ONLY
CHLAMYDIA, DNA PROBE: NEGATIVE
NEISSERIA GONORRHEA: NEGATIVE
Trichomonas: POSITIVE — AB

## 2018-01-02 ENCOUNTER — Other Ambulatory Visit: Payer: Self-pay | Admitting: Advanced Practice Midwife

## 2018-01-03 ENCOUNTER — Ambulatory Visit (INDEPENDENT_AMBULATORY_CARE_PROVIDER_SITE_OTHER): Payer: Medicaid Other | Admitting: Family Medicine

## 2018-01-03 ENCOUNTER — Other Ambulatory Visit (HOSPITAL_COMMUNITY)
Admission: RE | Admit: 2018-01-03 | Discharge: 2018-01-03 | Disposition: A | Discharge status: Home / Self Care | Payer: Medicaid Other | Source: Ambulatory Visit | Attending: Family Medicine | Admitting: Family Medicine

## 2018-01-03 ENCOUNTER — Ambulatory Visit (INDEPENDENT_AMBULATORY_CARE_PROVIDER_SITE_OTHER): Payer: Medicaid Other | Admitting: *Deleted

## 2018-01-03 ENCOUNTER — Ambulatory Visit: Payer: Self-pay

## 2018-01-03 ENCOUNTER — Encounter: Payer: Self-pay | Admitting: Obstetrics and Gynecology

## 2018-01-03 VITALS — BP 169/101 | HR 72 | Wt 302.4 lb

## 2018-01-03 DIAGNOSIS — Z23 Encounter for immunization: Secondary | ICD-10-CM

## 2018-01-03 DIAGNOSIS — O23593 Infection of other part of genital tract in pregnancy, third trimester: Secondary | ICD-10-CM | POA: Diagnosis not present

## 2018-01-03 DIAGNOSIS — A5901 Trichomonal vulvovaginitis: Secondary | ICD-10-CM

## 2018-01-03 DIAGNOSIS — O09523 Supervision of elderly multigravida, third trimester: Secondary | ICD-10-CM | POA: Diagnosis not present

## 2018-01-03 DIAGNOSIS — O10919 Unspecified pre-existing hypertension complicating pregnancy, unspecified trimester: Secondary | ICD-10-CM

## 2018-01-03 DIAGNOSIS — O0993 Supervision of high risk pregnancy, unspecified, third trimester: Secondary | ICD-10-CM | POA: Diagnosis not present

## 2018-01-03 DIAGNOSIS — O10913 Unspecified pre-existing hypertension complicating pregnancy, third trimester: Secondary | ICD-10-CM

## 2018-01-03 DIAGNOSIS — O23599 Infection of other part of genital tract in pregnancy, unspecified trimester: Secondary | ICD-10-CM | POA: Diagnosis present

## 2018-01-03 DIAGNOSIS — O09529 Supervision of elderly multigravida, unspecified trimester: Secondary | ICD-10-CM

## 2018-01-03 DIAGNOSIS — O0990 Supervision of high risk pregnancy, unspecified, unspecified trimester: Secondary | ICD-10-CM

## 2018-01-03 DIAGNOSIS — A599 Trichomoniasis, unspecified: Secondary | ICD-10-CM

## 2018-01-03 HISTORY — DX: Trichomoniasis, unspecified: A59.9

## 2018-01-03 NOTE — Patient Instructions (Signed)

## 2018-01-03 NOTE — Progress Notes (Signed)
    PRENATAL VISIT NOTE  Subjective:  Felicia HarrisJennifer Brennan is a 37 y.o. G2P0010 at 5469w5d being seen today for ongoing prenatal care.  She is currently monitored for the following issues for this high-risk pregnancy and has Supervision of high risk pregnancy, antepartum; Chronic hypertension during pregnancy, antepartum; Antepartum multigravida of advanced maternal age; Trichomonal vaginitis in pregnancy; Abnormal Pap smear of cervix; and Abnormal quad screen on their problem list.  Patient reports no complaints.  Contractions: Not present. Vag. Bleeding: None.  Movement: Present. Denies leaking of fluid.   The following portions of the patient's history were reviewed and updated as appropriate: allergies, current medications, past family history, past medical history, past social history, past surgical history and problem list. Problem list updated.  Objective:   Vitals:   01/03/18 0901 01/03/18 0926  BP: (!) 164/94 (!) 169/101  Pulse: 72   Weight: (!) 302 lb 6.4 oz (137.2 kg)     Fetal Status: Fetal Heart Rate (bpm): NST   Movement: Present     General:  Alert, oriented and cooperative. Patient is in no acute distress.  Skin: Skin is warm and dry. No rash noted.   Cardiovascular: Normal heart rate noted  Respiratory: Normal respiratory effort, no problems with respiration noted  Abdomen: Soft, gravid, appropriate for gestational age.  Pain/Pressure: Present     Pelvic: Cervical exam deferred        Extremities: Normal range of motion.     Mental Status: Normal mood and affect. Normal behavior. Normal judgment and thought content.  NST:  Baseline: 140 bpm, Variability: Good {> 6 bpm), Accelerations: Reactive and Decelerations: Absent U/s on 6/4 shows appropriate growth, nml fluid, vtx Assessment and Plan:  Pregnancy: G2P0010 at 5469w5d  1. Supervision of high risk pregnancy, antepartum  - Tdap vaccine greater than or equal to 7yo IM  2. Chronic hypertension during pregnancy,  antepartum BPs are creeping up--warning signs reviewed, if labs are abnl--will call in for earlier IOL On labetalol, not doing ASA IOL scheduled in 2 days- CBC - Comprehensive metabolic panel - Protein / creatinine ratio, urine  3. Antepartum multigravida of advanced maternal age Declined further screening  4. Trichomonal vaginitis during pregnancy, antepartum TOC today - Cervicovaginal ancillary only  Preterm labor symptoms and general obstetric precautions including but not limited to vaginal bleeding, contractions, leaking of fluid and fetal movement were reviewed in detail with the patient. Please refer to After Visit Summary for other counseling recommendations.  Return in about 5 weeks (around 02/07/2018) for PP visit.  IOL on 6/22.  Future Appointments  Date Time Provider Department Center  01/05/2018  7:00 AM [facility] ROOM [facility] None    Reva Boresanya S Lenell Lama, MD

## 2018-01-03 NOTE — Progress Notes (Addendum)
Pt states she has not taken morning dose of Labetalol today. She denies H/A or visual disturbances.   IOL scheduled 6/22 @ 0700

## 2018-01-03 NOTE — Progress Notes (Signed)

## 2018-01-04 LAB — COMPREHENSIVE METABOLIC PANEL
ALK PHOS: 107 IU/L (ref 39–117)
ALT: 7 IU/L (ref 0–32)
AST: 15 IU/L (ref 0–40)
Albumin/Globulin Ratio: 1.2 (ref 1.2–2.2)
Albumin: 3.7 g/dL (ref 3.5–5.5)
BUN/Creatinine Ratio: 22 (ref 9–23)
BUN: 14 mg/dL (ref 6–20)
CHLORIDE: 103 mmol/L (ref 96–106)
CO2: 17 mmol/L — AB (ref 20–29)
Calcium: 9.5 mg/dL (ref 8.7–10.2)
Creatinine, Ser: 0.63 mg/dL (ref 0.57–1.00)
GFR calc Af Amer: 133 mL/min/{1.73_m2} (ref >59)
GFR calc non Af Amer: 116 mL/min/{1.73_m2} (ref >59)
GLUCOSE: 82 mg/dL (ref 65–99)
Globulin, Total: 3.2 g/dL (ref 1.5–4.5)
Potassium: 4.3 mmol/L (ref 3.5–5.2)
SODIUM: 136 mmol/L (ref 134–144)
Total Protein: 6.9 g/dL (ref 6.0–8.5)

## 2018-01-04 LAB — CBC
Hematocrit: 35.1 % (ref 34.0–46.6)
Hemoglobin: 12 g/dL (ref 11.1–15.9)
MCH: 30.6 pg (ref 26.6–33.0)
MCHC: 34.2 g/dL (ref 31.5–35.7)
MCV: 90 fL (ref 79–97)
Platelets: 221 10*3/uL (ref 150–450)
RBC: 3.92 x10E6/uL (ref 3.77–5.28)
RDW: 14.4 % (ref 12.3–15.4)
WBC: 9.9 10*3/uL (ref 3.4–10.8)

## 2018-01-04 LAB — PROTEIN / CREATININE RATIO, URINE
CREATININE, UR: 149.2 mg/dL
PROTEIN UR: 26.9 mg/dL
Protein/Creat Ratio: 180 mg/g creat (ref 0–200)

## 2018-01-04 LAB — CERVICOVAGINAL ANCILLARY ONLY: TRICH (WINDOWPATH): POSITIVE — AB

## 2018-01-04 MED ORDER — METRONIDAZOLE 500 MG PO TABS: 500.0000 mg | ORAL_TABLET | Freq: Two times a day (BID) | ORAL | 0 refills | Status: DC

## 2018-01-04 NOTE — Addendum Note (Signed)
Addended by: Reva BoresPRATT, Eliyana Pagliaro S on: 01/04/2018 08:19 PM   Modules accepted: Orders

## 2018-01-05 ENCOUNTER — Other Ambulatory Visit: Payer: Self-pay

## 2018-01-05 ENCOUNTER — Encounter (HOSPITAL_COMMUNITY): Payer: Self-pay

## 2018-01-05 ENCOUNTER — Inpatient Hospital Stay (HOSPITAL_COMMUNITY)
Admission: RE | Admit: 2018-01-05 | Discharge: 2018-01-09 | DRG: 787 | Disposition: A | Discharge status: Home / Self Care | Payer: Medicaid Other | Attending: Obstetrics and Gynecology | Admitting: Obstetrics and Gynecology

## 2018-01-05 DIAGNOSIS — O10919 Unspecified pre-existing hypertension complicating pregnancy, unspecified trimester: Secondary | ICD-10-CM | POA: Diagnosis present

## 2018-01-05 DIAGNOSIS — O1092 Unspecified pre-existing hypertension complicating childbirth: Secondary | ICD-10-CM | POA: Diagnosis not present

## 2018-01-05 DIAGNOSIS — F17210 Nicotine dependence, cigarettes, uncomplicated: Secondary | ICD-10-CM | POA: Diagnosis present

## 2018-01-05 DIAGNOSIS — O23593 Infection of other part of genital tract in pregnancy, third trimester: Secondary | ICD-10-CM

## 2018-01-05 DIAGNOSIS — O99214 Obesity complicating childbirth: Secondary | ICD-10-CM | POA: Diagnosis present

## 2018-01-05 DIAGNOSIS — A5901 Trichomonal vulvovaginitis: Secondary | ICD-10-CM | POA: Diagnosis present

## 2018-01-05 DIAGNOSIS — O114 Pre-existing hypertension with pre-eclampsia, complicating childbirth: Secondary | ICD-10-CM | POA: Diagnosis present

## 2018-01-05 DIAGNOSIS — Z3A39 39 weeks gestation of pregnancy: Secondary | ICD-10-CM | POA: Diagnosis not present

## 2018-01-05 DIAGNOSIS — O99334 Smoking (tobacco) complicating childbirth: Secondary | ICD-10-CM | POA: Diagnosis present

## 2018-01-05 DIAGNOSIS — O9902 Anemia complicating childbirth: Secondary | ICD-10-CM | POA: Diagnosis present

## 2018-01-05 DIAGNOSIS — Z3490 Encounter for supervision of normal pregnancy, unspecified, unspecified trimester: Secondary | ICD-10-CM

## 2018-01-05 DIAGNOSIS — D649 Anemia, unspecified: Secondary | ICD-10-CM | POA: Diagnosis present

## 2018-01-05 DIAGNOSIS — E669 Obesity, unspecified: Secondary | ICD-10-CM | POA: Diagnosis not present

## 2018-01-05 DIAGNOSIS — O9832 Other infections with a predominantly sexual mode of transmission complicating childbirth: Secondary | ICD-10-CM | POA: Diagnosis present

## 2018-01-05 DIAGNOSIS — O1424 HELLP syndrome, complicating childbirth: Secondary | ICD-10-CM | POA: Diagnosis not present

## 2018-01-05 DIAGNOSIS — Z98891 History of uterine scar from previous surgery: Secondary | ICD-10-CM

## 2018-01-05 DIAGNOSIS — O119 Pre-existing hypertension with pre-eclampsia, unspecified trimester: Secondary | ICD-10-CM

## 2018-01-05 DIAGNOSIS — O1002 Pre-existing essential hypertension complicating childbirth: Secondary | ICD-10-CM | POA: Diagnosis present

## 2018-01-05 DIAGNOSIS — O09523 Supervision of elderly multigravida, third trimester: Secondary | ICD-10-CM | POA: Diagnosis not present

## 2018-01-05 DIAGNOSIS — R87618 Other abnormal cytological findings on specimens from cervix uteri: Secondary | ICD-10-CM

## 2018-01-05 HISTORY — DX: Obesity, unspecified: E66.9

## 2018-01-05 HISTORY — DX: Trichomoniasis, unspecified: A59.9

## 2018-01-05 LAB — COMPREHENSIVE METABOLIC PANEL
ALT: 10 U/L — AB (ref 14–54)
AST: 17 U/L (ref 15–41)
Albumin: 2.9 g/dL — ABNORMAL LOW (ref 3.5–5.0)
Alkaline Phosphatase: 99 U/L (ref 38–126)
Anion gap: 9 (ref 5–15)
BUN: 11 mg/dL (ref 6–20)
CO2: 20 mmol/L — ABNORMAL LOW (ref 22–32)
CREATININE: 0.62 mg/dL (ref 0.44–1.00)
Calcium: 8.6 mg/dL — ABNORMAL LOW (ref 8.9–10.3)
Chloride: 106 mmol/L (ref 101–111)
Glucose, Bld: 99 mg/dL (ref 65–99)
Potassium: 3.7 mmol/L (ref 3.5–5.1)
Sodium: 135 mmol/L (ref 135–145)
TOTAL PROTEIN: 6.9 g/dL (ref 6.5–8.1)
Total Bilirubin: 0.2 mg/dL — ABNORMAL LOW (ref 0.3–1.2)

## 2018-01-05 LAB — CBC
HCT: 31.8 % — ABNORMAL LOW (ref 36.0–46.0)
HEMATOCRIT: 33.1 % — AB (ref 36.0–46.0)
HEMOGLOBIN: 11 g/dL — AB (ref 12.0–15.0)
HEMOGLOBIN: 10.7 g/dL — AB (ref 12.0–15.0)
MCH: 30.1 pg (ref 26.0–34.0)
MCH: 30.7 pg (ref 26.0–34.0)
MCHC: 33.2 g/dL (ref 30.0–36.0)
MCHC: 33.6 g/dL (ref 30.0–36.0)
MCV: 90.7 fL (ref 78.0–100.0)
MCV: 91.1 fL (ref 78.0–100.0)
Platelets: 197 10*3/uL (ref 150–400)
Platelets: 186 10*3/uL (ref 150–400)
RBC: 3.65 MIL/uL — AB (ref 3.87–5.11)
RBC: 3.49 MIL/uL — AB (ref 3.87–5.11)
RDW: 13.7 % (ref 11.5–15.5)
RDW: 13.8 % (ref 11.5–15.5)
WBC: 9.1 10*3/uL (ref 4.0–10.5)
WBC: 10 10*3/uL (ref 4.0–10.5)

## 2018-01-05 LAB — PROTEIN / CREATININE RATIO, URINE
Creatinine, Urine: 85 mg/dL
Protein Creatinine Ratio: 0.11 mg/mg{Cre} (ref 0.00–0.15)
TOTAL PROTEIN, URINE: 9 mg/dL

## 2018-01-05 LAB — TYPE AND SCREEN
ABO/RH(D): A POS
Antibody Screen: NEGATIVE

## 2018-01-05 LAB — RPR: RPR: NONREACTIVE

## 2018-01-05 LAB — ABO/RH: ABO/RH(D): A POS

## 2018-01-05 MED ORDER — MISOPROSTOL 50MCG HALF TABLET
50.0000 ug | ORAL_TABLET | ORAL | Status: DC | PRN
  Administered 2018-01-05 (×2): 50 ug via BUCCAL
  Filled 2018-01-05 (×3): qty 1

## 2018-01-05 MED ORDER — DEXTROSE 5 % IV SOLN: 2000.0000 mg | Freq: Once | INTRAVENOUS | Status: DC

## 2018-01-05 MED ORDER — LABETALOL HCL 200 MG PO TABS: 200.0000 mg | ORAL_TABLET | Freq: Two times a day (BID) | ORAL | Status: DC

## 2018-01-05 MED ORDER — MAGNESIUM SULFATE BOLUS VIA INFUSION
4.0000 g | Freq: Once | INTRAVENOUS | Status: AC
  Administered 2018-01-05: 4 g via INTRAVENOUS
  Filled 2018-01-05: qty 500

## 2018-01-05 MED ORDER — LACTATED RINGERS IV SOLN
INTRAVENOUS | Status: DC
  Administered 2018-01-05 – 2018-01-07 (×6): via INTRAVENOUS

## 2018-01-05 MED ORDER — MISOPROSTOL 25 MCG QUARTER TABLET
25.0000 ug | ORAL_TABLET | Freq: Once | ORAL | Status: AC
  Administered 2018-01-05: 25 ug via BUCCAL

## 2018-01-05 MED ORDER — OXYCODONE-ACETAMINOPHEN 5-325 MG PO TABS
2.0000 | ORAL_TABLET | ORAL | Status: DC | PRN
Start: 2018-01-05 — End: 2018-01-07

## 2018-01-05 MED ORDER — MISOPROSTOL 25 MCG QUARTER TABLET
25.0000 ug | ORAL_TABLET | ORAL | Status: DC | PRN
  Administered 2018-01-05: 25 ug via BUCCAL
  Filled 2018-01-05 (×2): qty 1

## 2018-01-05 MED ORDER — OXYTOCIN 40 UNITS IN LACTATED RINGERS INFUSION - SIMPLE MED: 2.5000 [IU]/h | INTRAVENOUS | Status: DC

## 2018-01-05 MED ORDER — ACETAMINOPHEN 325 MG PO TABS
650.0000 mg | ORAL_TABLET | ORAL | Status: DC | PRN
  Administered 2018-01-06: 650 mg via ORAL
  Filled 2018-01-05: qty 2

## 2018-01-05 MED ORDER — METRONIDAZOLE 500 MG PO TABS
2000.0000 mg | ORAL_TABLET | Freq: Once | ORAL | Status: AC
  Administered 2018-01-05: 2000 mg via ORAL
  Filled 2018-01-05: qty 4

## 2018-01-05 MED ORDER — ZOLPIDEM TARTRATE 5 MG PO TABS: 5.0000 mg | ORAL_TABLET | Freq: Every evening | ORAL | Status: DC | PRN

## 2018-01-05 MED ORDER — HYDRALAZINE HCL 20 MG/ML IJ SOLN
10.0000 mg | Freq: Once | INTRAMUSCULAR | Status: AC | PRN
  Administered 2018-01-07: 10 mg via INTRAVENOUS
  Filled 2018-01-05: qty 1

## 2018-01-05 MED ORDER — TERBUTALINE SULFATE 1 MG/ML IJ SOLN: 0.2500 mg | Freq: Once | INTRAMUSCULAR | Status: DC | PRN

## 2018-01-05 MED ORDER — LABETALOL HCL 200 MG PO TABS
200.0000 mg | ORAL_TABLET | Freq: Once | ORAL | Status: AC
  Administered 2018-01-05: 200 mg via ORAL
  Filled 2018-01-05: qty 1

## 2018-01-05 MED ORDER — OXYCODONE-ACETAMINOPHEN 5-325 MG PO TABS
1.0000 | ORAL_TABLET | ORAL | Status: DC | PRN
Start: 2018-01-05 — End: 2018-01-07

## 2018-01-05 MED ORDER — LABETALOL HCL 200 MG PO TABS
400.0000 mg | ORAL_TABLET | Freq: Two times a day (BID) | ORAL | Status: DC
  Administered 2018-01-05 – 2018-01-06 (×3): 400 mg via ORAL
  Filled 2018-01-05 (×3): qty 2

## 2018-01-05 MED ORDER — ONDANSETRON HCL 4 MG/2ML IJ SOLN
4.0000 mg | Freq: Four times a day (QID) | INTRAMUSCULAR | Status: DC | PRN
  Administered 2018-01-06 (×2): 4 mg via INTRAVENOUS
  Filled 2018-01-05 (×2): qty 2

## 2018-01-05 MED ORDER — LABETALOL HCL 5 MG/ML IV SOLN
20.0000 mg | INTRAVENOUS | Status: AC | PRN
  Administered 2018-01-05: 20 mg via INTRAVENOUS
  Administered 2018-01-06: 40 mg via INTRAVENOUS
  Administered 2018-01-06: 80 mg via INTRAVENOUS
  Filled 2018-01-05 (×3): qty 4
  Filled 2018-01-05: qty 16

## 2018-01-05 MED ORDER — OXYTOCIN BOLUS FROM INFUSION: 500.0000 mL | Freq: Once | INTRAVENOUS | Status: DC

## 2018-01-05 MED ORDER — DEXTROSE 5 % IV SOLN: 2000.0000 mg | INTRAVENOUS | Status: DC

## 2018-01-05 MED ORDER — MISOPROSTOL 25 MCG QUARTER TABLET
25.0000 ug | ORAL_TABLET | ORAL | Status: DC
  Administered 2018-01-05 – 2018-01-06 (×2): 25 ug via VAGINAL
  Filled 2018-01-05 (×2): qty 1

## 2018-01-05 MED ORDER — LACTATED RINGERS IV SOLN: 500.0000 mL | INTRAVENOUS | Status: DC | PRN

## 2018-01-05 MED ORDER — FENTANYL CITRATE (PF) 100 MCG/2ML IJ SOLN
100.0000 ug | INTRAMUSCULAR | Status: DC | PRN
  Administered 2018-01-06 – 2018-01-07 (×2): 100 ug via INTRAVENOUS
  Filled 2018-01-05 (×2): qty 2

## 2018-01-05 MED ORDER — LIDOCAINE HCL (PF) 1 % IJ SOLN: 30.0000 mL | INTRAMUSCULAR | Status: DC | PRN

## 2018-01-05 MED ORDER — MAGNESIUM SULFATE 40 G IN LACTATED RINGERS - SIMPLE
2.0000 g/h | INTRAVENOUS | Status: DC
  Administered 2018-01-06 – 2018-01-07 (×2): 2 g/h via INTRAVENOUS
  Filled 2018-01-05 (×2): qty 40
  Filled 2018-01-05: qty 500

## 2018-01-05 MED ORDER — SOD CITRATE-CITRIC ACID 500-334 MG/5ML PO SOLN
30.0000 mL | ORAL | Status: DC | PRN
Start: 2018-01-05 — End: 2018-01-07
  Administered 2018-01-07: 30 mL via ORAL
  Filled 2018-01-05: qty 15

## 2018-01-05 MED ORDER — MISOPROSTOL 25 MCG QUARTER TABLET
25.0000 ug | ORAL_TABLET | Freq: Once | ORAL | Status: AC
  Administered 2018-01-05: 25 ug via VAGINAL

## 2018-01-05 NOTE — Anesthesia Pain Management Evaluation Note (Signed)
  CRNA Pain Management Visit Note  Patient: Felicia Brennan Isidore, 37 y.o., female  "Hello I am a member of the anesthesia team at [facility]. We have an anesthesia team available at all times to provide care throughout the hospital, including epidural management and anesthesia for C-section. I don't know your plan for the delivery whether it a natural birth, water birth, IV sedation, nitrous supplementation, doula or epidural, but we want to meet your pain goals."   1.Was your pain managed to your expectations on prior hospitalizations?   No prior hospitalizations  2.What is your expectation for pain management during this hospitalization?     IV pain meds  3.How can we help you reach that goal?   Record the patient's initial score and the patient's pain goal.   Pain: 0  Pain Goal: 8 [facility] wants you to be able to say your pain was always managed very well.  Laban EmperorMalinova,Gaynell Eggleton Hristova 01/05/2018

## 2018-01-05 NOTE — Progress Notes (Signed)
   Felicia HarrisJennifer Brennan is a 37 y.o. G2P0010 at 10450w0d  admitted for induction of labor due to Hypertension.  Subjective:  Patient comfortable in bed; MgSo4 infusing and patient has no complaints. Patient denies blurry vision, HA, NV, sudden swelling.  Objective: Vitals:   01/05/18 1306 01/05/18 1311 01/05/18 1332 01/05/18 1415  BP:  (!) 153/69 (!) 159/83 (!) 153/68  Pulse:  88 85 80  Resp:  20 20 18   Temp:      TempSrc:      SpO2: 99% 98%    Weight:      Height:       Total I/O In: 756.3 [P.O.:100; I.V.:656.3] Out: 0   FHT:  FHR: 145 bpm, variability: moderate,  accelerations:  Abscent,  decelerations:  Absent. Patient had one 15X 15 in past  UC:   none SVE:   Dilation: Fingertip Effacement (%): Thick Station: Ballotable Exam by:: Samara DeistKathryn, CNM    Labs: Lab Results  Component Value Date   WBC 9.1 01/05/2018   HGB 11.0 (L) 01/05/2018   HCT 33.1 (L) 01/05/2018   MCV 90.7 01/05/2018   PLT 197 01/05/2018    Assessment / Plan: early labor; no signs of pre-e with severe features.   MgSo4 is infusing; no complaints.   Patient had two severe range pressures followed immediately by non-severe pressures. If patient has another severe range pressure, will administer IV BP meds.  Repeat pre-e labs now.   Labor: early labor Fetal Wellbeing:  Category II. No accelerations, most likely due to magnesium infusion.  Pain Control:  Labor support without medications Anticipated MOD:  NSVD  Marylene LandKathryn Lorraine Jalyssa Fleisher 01/05/2018, 2:19 PM

## 2018-01-05 NOTE — H&P (Addendum)
LABOR ADMISSION HISTORY AND PHYSICAL  Felicia Brennan is a 37 y.o. female G2P0010 with IUP at [redacted]w[redacted]d by LMP presenting for IOL for cHTN. She reports +FMs, No LOF, no VB, no blurry vision, headaches or peripheral edema, and RUQ pain.  She plans on ** feeding. She request ** for birth control.  Dating: By LMP --->  Estimated Date of Delivery: 01/12/18  Prenatal History/Complications: [facility] Office: [facility] Patient Active Problem List   Diagnosis Date Noted  . Encounter for induction of labor 01/05/2018  . Abnormal quad screen 07/27/2017  . Trichomonal vaginitis in pregnancy 07/25/2017  . Abnormal Pap smear of cervix 07/25/2017  . Supervision of high risk pregnancy, antepartum 07/11/2017  . Chronic hypertension during pregnancy, antepartum 07/11/2017  . Antepartum multigravida of advanced maternal age 93/26/2018   Past Medical History: Past Medical History:  Diagnosis Date  . Hypertension   . Vaginal Pap smear, abnormal     Past Surgical History: Past Surgical History:  Procedure Laterality Date  . NO PAST SURGERIES      Obstetrical History: OB History    Gravida  2   Para      Term      Preterm      AB  1   Living        SAB  1   TAB      Ectopic      Multiple      Live Births              Social History: Social History   Socioeconomic History  . Marital status: Single    Spouse name: Not on file  . Number of children: Not on file  . Years of education: Not on file  . Highest education level: Not on file  Occupational History  . Not on file  Social Needs  . Financial resource strain: Not on file  . Food insecurity:    Worry: Not on file    Inability: Not on file  . Transportation needs:    Medical: Not on file    Non-medical: Not on file  Tobacco Use  . Smoking status: Current Some Day Smoker    Packs/day: 0.25    Types: Cigarettes  . Smokeless tobacco: Never Used  Substance and Sexual Activity  . Alcohol use: No    Frequency: Never  .  Drug use: Yes    Types: Marijuana  . Sexual activity: Yes    Birth control/protection: None  Lifestyle  . Physical activity:    Days per week: Not on file    Minutes per session: Not on file  . Stress: Not on file  Relationships  . Social connections:    Talks on phone: Not on file    Gets together: Not on file    Attends religious service: Not on file    Active member of club or organization: Not on file    Attends meetings of clubs or organizations: Not on file    Relationship status: Not on file  Other Topics Concern  . Not on file  Social History Narrative  . Not on file    Family History: Family History  Problem Relation Age of Onset  . Hypertension Mother   . Pancreatic cancer Mother   . COPD Father     Allergies: No Known Allergies  Medications Prior to Admission  Medication Sig Dispense Refill Last Dose  . aspirin 81 MG tablet Take 1 tablet (81 mg total) by mouth daily. (  Patient not taking: Reported on 10/23/2017) 30 tablet 6 Not Taking  . labetalol (NORMODYNE) 200 MG tablet Take 1 tablet (200 mg total) by mouth 2 (two) times daily. 60 tablet 1 Taking  . metroNIDAZOLE (FLAGYL) 500 MG tablet Take 1 tablet (500 mg total) by mouth 2 (two) times daily. 14 tablet 0   . Prenatal Multivit-Min-Fe-FA (PRENATAL VITAMINS) 0.8 MG tablet Take 1 tablet by mouth daily. 30 tablet 12 Taking    Review of Systems   All systems reviewed and negative except as stated in HPI  Last menstrual period 03/28/2017. General appearance: alert and cooperative HEENT:  Abdomen: soft, non-tender; bowel sounds normal Pelvic:  Extremities: Homans sign is negative, no sign of DVT DTR's  Presentation:  Fetal monitoring Uterine activity    Prenatal labs: ABO, Rh: A/Positive/-- (12/26 1610) Antibody: Negative (12/26 0923) Rubella: 1.03 (12/26 0923) RPR: Non Reactive (05/14 1005)  HBsAg: Negative (12/26 0923)  HIV: Non Reactive (05/14 1005)  GBS:   negative 2 hr Glucola wnl  Prenatal  Transfer Tool  Maternal Diabetes: No Genetic Screening: Abnormal:  Results: Elevated risk of Trisomy 21, Elevated risk of Trisomy 18 Maternal Ultrasounds/Referrals: Normal Fetal Ultrasounds or other Referrals:  None Maternal Substance Abuse:  No Significant Maternal Medications:  None Significant Maternal Lab Results: Lab values include: Group B Strep negative  No results found for this or any previous visit (from the past 24 hour(s)).  Patient Active Problem List   Diagnosis Date Noted  . Encounter for induction of labor 01/05/2018  . Abnormal quad screen 07/27/2017  . Trichomonal vaginitis in pregnancy 07/25/2017  . Abnormal Pap smear of cervix 07/25/2017  . Supervision of high risk pregnancy, antepartum 07/11/2017  . Chronic hypertension during pregnancy, antepartum 07/11/2017  . Antepartum multigravida of advanced maternal age 100/26/2018    Assessment: Felicia Brennan is a 37 y.o. G2P0010 at [redacted]w[redacted]d here for IOL for cHTN  #HTN: Takes labetalol 200 mg BID, monitor BP,  #Labor:cervical ripening with cytotec #Pain:  #FWB:  #ID:  GBS neg #MOF: breast #MOC: Pills #Circ:  N/A  Thomes Dinning, MD, MS FAMILY MEDICINE RESIDENT - PGY1 01/05/2018 6:51 AM   CNM attestation:  I have seen and examined this patient and agree with above documentation in the resident's note.   Felicia Brennan is a 37 y.o. G2P0010 at [redacted]w[redacted]d reporting for IOL for chronic hypertension. Denies s/s of pre-e right now. BP elevated but not severe today.  +FM, denies LOF, VB, contractions, vaginal discharge.  PE: Patient Vitals for the past 24 hrs:  BP Temp Temp src Pulse Resp Height Weight  01/05/18 0837 (!) 149/79 - - 73 20 - -  01/05/18 0726 (!) 157/91 98.5 F (36.9 C) Oral 86 18 5\' 8"  (1.727 m) (!) 307 lb 3.2 oz (139.3 kg)   Gen: calm comfortable, NAD Resp: normal effort, no distress Heart: Regular rate Abd: Soft, NT, gravid, S=D  FHR: Baseline 145 mod Variability, pos accels, no decels Toco:  UC's quiescent  ROS, labs, PMH reviewed  Orders Placed This Encounter  Procedures  . OB RESULT CONSOLE Group B Strep  . CBC  . RPR  . Diet regular Room service appropriate? Yes; Fluid consistency: Thin  . Discontinue Pitocin if tachysystole with non-reassuring FHR is present  . Evaluate fetal heart rate to establish reassuring pattern prior to initiating Cytotec or Pitocin  . If tachysystole WITH reassuring FHR present notify MD / CNM  . Initiate intrauterine resuscitation if tachysystole with non-reasuring FHR is present  .  Labor Induction  . May administer Terbutaline 0.25 mg SQ x 1 dose if tachysystole with non-reassuring FHR is presesnt  . Nofify MD/CNM if tachysystole with non-reassuring FHR is present  . Perform a cervical exam prior to initiating Cytotec or Pitocin  . Activity as tolerated  . Fetal monitoring per unit policy  . Notify Physician  . Vitals signs per unit policy  . Order Rapid HIV per protocol if no results on chart  . Cervical Exam  . Discontinue foley prior to vaginal delivery  . Fundal check post delivery every 15 min x 1 hour then every 30 min x 1 hour  . If Rapid HIV test positive or known HIV positive: initiate AZT orders  . Insert foley catheter  . May in and out cath x 2 for inability to void  . Measure blood pressure post delivery every 15 min x 1 hour then every 30 min x 1 hour  . Full code  . Nitrous Oxide 50%/Oxygen 50%  . Oxygen therapy  . Type and screen  . Insert and maintain IV Line  . Admit to Inpatient (patient's expected length of stay will be greater than 2 midnights or inpatient only procedure)   Meds ordered this encounter  Medications  . misoprostol (CYTOTEC) tablet 25 mcg  . terbutaline (BRETHINE) injection 0.25 mg  . lactated ringers infusion  . lactated ringers infusion 500-1,000 mL  . oxytocin (PITOCIN) IV BOLUS FROM BAG  . oxytocin (PITOCIN) IV infusion 40 units in LR 1000 mL - Premix  . acetaminophen (TYLENOL) tablet 650  mg  . fentaNYL (SUBLIMAZE) injection 100 mcg  . lidocaine (PF) (XYLOCAINE) 1 % injection 30 mL  . ondansetron (ZOFRAN) injection 4 mg  . oxyCODONE-acetaminophen (PERCOCET/ROXICET) 5-325 MG per tablet 1 tablet  . oxyCODONE-acetaminophen (PERCOCET/ROXICET) 5-325 MG per tablet 2 tablet  . sodium citrate-citric acid (ORACIT) solution 30 mL  . zolpidem (AMBIEN) tablet 5 mg  . labetalol (NORMODYNE) tablet 200 mg  . DISCONTD: azithromycin (ZITHROMAX) 2,000 mg in dextrose 5 % 250 mL IVPB    Order Specific Question:   Antibiotic Indication:    Answer:   Other Indication (list below)    Order Specific Question:   Other Indication:    Answer:   Trich  . DISCONTD: azithromycin (ZITHROMAX) 2,000 mg in dextrose 5 % 250 mL IVPB    Order Specific Question:   Antibiotic Indication:    Answer:   Other Indication (list below)    Order Specific Question:   Other Indication:    Answer:   Trich  . metroNIDAZOLE (FLAGYL) tablet 2,000 mg    MDM   Assessment: 1. Trichomonal vaginitis during pregnancy in third trimester   2. Other abnormal cytological finding of specimen from cervix     Plan: 1. Treat for trichomoniasis with flagyl IP.  2. Start induction with 50 mcg Cytotec buccal 3. Labetalol 200 BID 4. Per note from Genetic counselor in January 2019; patient's AFP was drawn too early as LMP was unsure. At genetic testing appt in January, 2019, patient declined a second blood draw and preferred to wait for US results. Patient now states that she remembers she didn't want her blood drawn at that appointment and that "no one told her anything" other than her US results were normal.   Marylene LandKooistra, Kathryn Lorraine, CNM 01/05/2018 9:13 AM

## 2018-01-05 NOTE — Progress Notes (Signed)
LABOR PROGRESS NOTE  Felicia HarrisJennifer Brennan is a 37 y.o. G2P0010 at 4464w0d  admitted for IOL for cHTN   Subjective: Patient doing well, comfortable- patient reports some cramping but not any contractions. Pain is manageable- rates pain 4/10.  Objective: BP (!) 151/81   Pulse 81   Temp 99 F (37.2 C) (Oral)   Resp 18   Ht 5\' 8"  (1.727 m)   Wt (!) 307 lb 3.2 oz (139.3 kg)   LMP 03/28/2017 (Approximate)   SpO2 98%   BMI 46.71 kg/m  or  Vitals:   01/05/18 2032 01/05/18 2102 01/05/18 2132 01/05/18 2202  BP: (!) 153/86 (!) 153/75 (!) 146/81 (!) 151/81  Pulse: 81 81 82 81  Resp: 18 18 18 18   Temp:      TempSrc:      SpO2:      Weight:      Height:        Foley bulb attempted- unsuccessful, vaginal cytotec placed  Dilation: Fingertip Effacement (%): Thick Cervical Position: Posterior Station: Ballotable Presentation: Vertex Exam by:: Samara DeistKathryn, CNM  FHT: baseline rate 150, moderate varibility, +acel, no decel Toco: UI with occasional mild contraction   Labs: Lab Results  Component Value Date   WBC 10.0 01/05/2018   HGB 10.7 (L) 01/05/2018   HCT 31.8 (L) 01/05/2018   MCV 91.1 01/05/2018   PLT 186 01/05/2018    Patient Active Problem List   Diagnosis Date Noted  . Encounter for induction of labor 01/05/2018  . Chronic hypertension in pregnancy 01/05/2018  . Abnormal quad screen 07/27/2017  . Trichomonal vaginitis in pregnancy 07/25/2017  . Abnormal Pap smear of cervix 07/25/2017  . Supervision of high risk pregnancy, antepartum 07/11/2017  . Chronic hypertension during pregnancy, antepartum 07/11/2017  . Antepartum multigravida of advanced maternal age 93/26/2018    Assessment / Plan: 37 y.o. G2P0010 at 7864w0d here for IOL for cHTN   Labor: Vaginal cytotec placed, recheck cervix in 4 hours will attempt foley bulb again  Fetal Wellbeing:  Cat I Pain Control:  Pain medication ordered PRN  Anticipated MOD:  SVD   Sharyon CableRogers, Antoniette Peake C, CNM 01/05/2018, 10:47 PM

## 2018-01-05 NOTE — Progress Notes (Signed)
   Felicia HarrisJennifer Brennan is a 37 y.o. G2P0010 at 2722w0d  admitted for induction of labor due to Hypertension.  Subjective:  Resting well; comfortable with MgSo4. Is not feeling any contractions right now. Denies blurry vision, HA, NV, RUQ.  Objective: Vitals:   01/05/18 1635 01/05/18 1709 01/05/18 1732 01/05/18 1802  BP: (!) 152/72 134/72 136/76 (!) 158/89  Pulse: 88 81 85 78  Resp: 18 16 16 16   Temp:      TempSrc:      SpO2:      Weight:      Height:       Total I/O In: 1618.8 [P.O.:400; I.V.:1218.8] Out: 1000 [Urine:1000]  FHT:  FHR: 140 bpm, variability: moderate,  accelerations:  Present,  decelerations:  Absent UC:   none SVE:   Dilation: Fingertip Effacement (%): Thick Station: Ballotable Exam by:: Samara DeistKathryn, CNM    Labs: Lab Results  Component Value Date   WBC 10.0 01/05/2018   HGB 10.7 (L) 01/05/2018   HCT 31.8 (L) 01/05/2018   MCV 91.1 01/05/2018   PLT 186 01/05/2018    Assessment / Plan: early labor; will do vaginal cytotec now 25 mgc.  BPs not severe; has only required one dose of 20 mg IV labetalol earlier today.  Labor: early labor Fetal Wellbeing:  Category I Pain Control:  Labor support without medications Anticipated MOD:  NSVD  Felicia LandKathryn Lorraine Brennan 01/05/2018, 6:56 PM

## 2018-01-06 ENCOUNTER — Encounter (HOSPITAL_COMMUNITY): Payer: Self-pay

## 2018-01-06 LAB — COMPREHENSIVE METABOLIC PANEL
ALBUMIN: 3 g/dL — AB (ref 3.5–5.0)
ALK PHOS: 108 U/L (ref 38–126)
ALT: 11 U/L — AB (ref 14–54)
AST: 19 U/L (ref 15–41)
Anion gap: 10 (ref 5–15)
BUN: 6 mg/dL (ref 6–20)
CALCIUM: 7.8 mg/dL — AB (ref 8.9–10.3)
CHLORIDE: 104 mmol/L (ref 101–111)
CO2: 18 mmol/L — AB (ref 22–32)
CREATININE: 0.55 mg/dL (ref 0.44–1.00)
GFR calc non Af Amer: >60 mL/min (ref >60)
GLUCOSE: 96 mg/dL (ref 65–99)
Potassium: 3.8 mmol/L (ref 3.5–5.1)
SODIUM: 132 mmol/L — AB (ref 135–145)
Total Bilirubin: 0.4 mg/dL (ref 0.3–1.2)
Total Protein: 7.1 g/dL (ref 6.5–8.1)

## 2018-01-06 LAB — CBC
HCT: 36.9 % (ref 36.0–46.0)
HEMOGLOBIN: 12.2 g/dL (ref 12.0–15.0)
MCH: 30.7 pg (ref 26.0–34.0)
MCHC: 33.1 g/dL (ref 30.0–36.0)
MCV: 92.7 fL (ref 78.0–100.0)
PLATELETS: 169 10*3/uL (ref 150–400)
RBC: 3.98 MIL/uL (ref 3.87–5.11)
RDW: 13.8 % (ref 11.5–15.5)
WBC: 13.3 10*3/uL — AB (ref 4.0–10.5)

## 2018-01-06 LAB — MAGNESIUM: Magnesium: 4.6 mg/dL — ABNORMAL HIGH (ref 1.7–2.4)

## 2018-01-06 MED ORDER — OXYTOCIN 40 UNITS IN LACTATED RINGERS INFUSION - SIMPLE MED
1.0000 m[IU]/min | INTRAVENOUS | Status: DC
  Administered 2018-01-06: 2 m[IU]/min via INTRAVENOUS
  Administered 2018-01-06: 18 m[IU]/min via INTRAVENOUS
  Administered 2018-01-07: 10 m[IU]/min via INTRAVENOUS
  Filled 2018-01-06 (×2): qty 1000

## 2018-01-06 MED ORDER — TERBUTALINE SULFATE 1 MG/ML IJ SOLN: 0.2500 mg | Freq: Once | INTRAMUSCULAR | Status: DC | PRN

## 2018-01-06 MED ORDER — PROMETHAZINE HCL 25 MG/ML IJ SOLN
12.5000 mg | Freq: Four times a day (QID) | INTRAMUSCULAR | Status: DC | PRN
  Administered 2018-01-06: 12.5 mg via INTRAVENOUS
  Filled 2018-01-06: qty 1

## 2018-01-06 NOTE — Progress Notes (Signed)
Foley bulb filled with 60cc of lr; foley taped to leg

## 2018-01-06 NOTE — Progress Notes (Signed)
Felicia HarrisJennifer Brennan is a 37 y.o. G2P0010 at 4089w1d by ultrasound admitted for induction of labor due to Hypertension.  Subjective:   Objective: BP (!) 153/81   Pulse 72   Temp 97.7 F (36.5 C) (Axillary)   Resp 18   Ht 5\' 8"  (1.727 m)   Wt (!) 307 lb 3.2 oz (139.3 kg)   LMP 03/28/2017 (Approximate)   SpO2 98%   BMI 46.71 kg/m  I/O last 3 completed shifts: In: 5043.8 [P.O.:2200; I.V.:2843.8] Out: 3300 [Urine:3300] No intake/output data recorded.  FHT:  FHR: 140's bpm, variability: moderate,  accelerations:  Present,  decelerations:  Absent UC:   regular, every 2-4  Minutes and mild SVE:   Dilation: 1.5 Effacement (%): Thick Station: -3 Exam by:: Felicia Brennan CNM  Labs: Lab Results  Component Value Date   WBC 10.0 01/05/2018   HGB 10.7 (L) 01/05/2018   HCT 31.8 (L) 01/05/2018   MCV 91.1 01/05/2018   PLT 186 01/05/2018    Assessment / Plan: IOL for CHTN Continue present POC  Labor: yet to be in labor Preeclampsia:  intake and ouput balanced Fetal Wellbeing:  Category I Pain Control:  IV pain meds I/D:  n/a Anticipated MOD:  NSVD  Felicia DuskyMarie Brennan 01/06/2018, 9:13 AM

## 2018-01-06 NOTE — Progress Notes (Signed)
Labor Progress Note Felicia HarrisJennifer Brennan is a 37 y.o. G2P0010 at 1229w1d presented for IOL for CHTN and superimposed preeclampsia  S:  Comfortable, feeling some ctx, declines pain meds. N/V have persisted most of the day, had Zofran x2.  O:  BP (!) 157/80   Pulse 79   Temp 99.7 F (37.6 C) (Oral)   Resp 18   Ht 5\' 8"  (1.727 m)   Wt (!) 307 lb 3.2 oz (139.3 kg)   LMP 03/28/2017 (Approximate)   SpO2 99%   BMI 46.71 kg/m  DTRs 2+ Bilateral hand grips, strong and equal EFM: baseline 140 bpm/ mod variability/ + accels/ no decels  Toco: 2-4 SVE: Dilation: 4 Effacement (%): 70 Cervical Position: Posterior Station: -3 Presentation: Vertex Exam by:: Dr. Emelda FearFerguson Pitocin: 16 mu/min MVU: 210  A/P: 37 y.o. G2P0010 2929w1d  1. Labor: latent 2. FWB: Cat I 3. Pain: analgesia/anesthesia prn 4. CHTN with superimposed pre-e- stable on Mg  Start Phenergan prn. Check Mg level and rpt pre-e labs.  Continue Pitocin to maintain adequate labor. Anticipate labor progression and SVD.  Donette LarryMelanie Yolani Vo, CNM 8:54 PM

## 2018-01-06 NOTE — Progress Notes (Signed)
Felicia HarrisJennifer Brennan is a 37 y.o. G2P0010 at 5881w1d by ultrasound admitted for induction of labor due to Hypertension.  Subjective:   Objective: BP (!) 159/89   Pulse 79   Temp 98.2 F (36.8 C) (Oral)   Resp 18   Ht 5\' 8"  (1.727 m)   Wt (!) 307 lb 3.2 oz (139.3 kg)   LMP 03/28/2017 (Approximate)   SpO2 98%   BMI 46.71 kg/m  I/O last 3 completed shifts: In: 5043.8 [P.O.:2200; I.V.:2843.8] Out: 3300 [Urine:3300] Total I/O In: 1325 [P.O.:360; I.V.:965] Out: 2200 [Urine:2200]  FHT:  FHR: 145-150 bpm, variability: moderate,  accelerations:  Abscent,  decelerations:  Absent UC:   irregular, every 2-5 minutes SVE:   Dilation: 4 Effacement (%): 70 Station: -3 Exam by:: Felicia Brennan, CNM   Labs: Lab Results  Component Value Date   WBC 10.0 01/05/2018   HGB 10.7 (L) 01/05/2018   HCT 31.8 (L) 01/05/2018   MCV 91.1 01/05/2018   PLT 186 01/05/2018    Assessment / Plan: [facility],  progressing well on pitocin  Labor: Progressing normally Preeclampsia:  no signs or symptoms of toxicity Fetal Wellbeing:  Category I Pain Control:  IV pain meds I/D:  n/a Anticipated MOD:  NSVD  Felicia DuskyMarie Brennan 01/06/2018, 2:50 PM

## 2018-01-06 NOTE — Progress Notes (Signed)
Felicia Brennan is a 37 y.o. G2P0010 at 90w1dby LMP admitted for induction of labor due to Hypertension. Since admission the pressures have met severe criteria, and pt is being treated as superimposed preeclampsia.  Subjective: pt tolerating labor well. Has FSE and IUPC in place since 1 pm, and is s/p FB, placed at 3 a.m.and out earlier this a.m . Sitting upright most of the time.   Objective: BP (!) 156/83   Pulse 77   Temp 99.7 F (37.6 C) (Oral)   Resp 16   Ht 5' 8"  (1.727 m)   Wt (!) 307 lb 3.2 oz (139.3 kg)   LMP 03/28/2017 (Approximate)   SpO2 99%   BMI 46.71 kg/m  I/O last 3 completed shifts: In: 7343.2 [P.O.:2800; I.V.:4543.2] Out: 6700 [Urine:6700] Total I/O In: 517[P.O.:50] Out: 150 [Urine:150]  FHT:  FHR: 140 bpm, variability: moderate,  accelerations:  Present,  decelerations:  Absent UC:   regular, every 2.5 minutes SVE:   Dilation: 4 Effacement (%): 70 Station: -3 Exam by:: Dr. FGlo HerringExam at 8 pm. IUPC NOTED TO HAVE MODERATE AMOUNT OF BLOOD IN CATHETER NEAREST TO PT.  At cervix exam the fluid is clear. Labs: Lab Results  Component Value Date   WBC 10.0 01/05/2018   HGB 10.7 (L) 01/05/2018   HCT 31.8 (L) 01/05/2018   MCV 91.1 01/05/2018   PLT 186 01/05/2018    Assessment / Plan: Induction of labor due to [facility] now superimposed PEC.,  progressing well on pitocin on 2 gm/ hr of Mag Sulfate. Possible extramembranous position of iupc, but functioning nicely. Labor: Progressing normally on Pitocin Preeclampsia:  on magnesium sulfate and no signs or symptoms of toxicity Fetal Wellbeing:  Category I Pain Control:  IV pain meds I/D:  n/a Anticipated MOD:  NSVD though early in labor.  JJonnie Kind6/23/2019, 9:15 PM

## 2018-01-06 NOTE — Progress Notes (Signed)
LABOR PROGRESS NOTE  Felicia HarrisJennifer Brennan is a 37 y.o. G2P0010 at 4474w1d  admitted for IOL for cHTN  Subjective: Patient comfortable, reports mild cramping no contractions   Objective: BP (!) 141/80   Pulse 85   Temp 98.9 F (37.2 C) (Oral)   Resp 20   Ht 5\' 8"  (1.727 m)   Wt (!) 307 lb 3.2 oz (139.3 kg)   LMP 03/28/2017 (Approximate)   SpO2 98%   BMI 46.71 kg/m  or  Vitals:   01/06/18 0106 01/06/18 0132 01/06/18 0220 01/06/18 0300  BP: 128/75 126/81 (!) 141/80   Pulse: 79 77 85   Resp: 18 16 20    Temp:   98 F (36.7 C) 98.9 F (37.2 C)  TempSrc:   Oral Oral  SpO2:      Weight:      Height:        FB placed @0340  Dilation: 1.5 Effacement (%): Thick Cervical Position: Posterior Station: -3 Presentation: Vertex Exam by:: Lanice ShirtsV Chukwudi Ewen CNM FHT: baseline rate 140, moderate varibility, +acel, variable decel Toco: UI   Labs: Lab Results  Component Value Date   WBC 10.0 01/05/2018   HGB 10.7 (L) 01/05/2018   HCT 31.8 (L) 01/05/2018   MCV 91.1 01/05/2018   PLT 186 01/05/2018    Patient Active Problem List   Diagnosis Date Noted  . Encounter for induction of labor 01/05/2018  . Chronic hypertension in pregnancy 01/05/2018  . Abnormal quad screen 07/27/2017  . Trichomonal vaginitis in pregnancy 07/25/2017  . Abnormal Pap smear of cervix 07/25/2017  . Supervision of high risk pregnancy, antepartum 07/11/2017  . Chronic hypertension during pregnancy, antepartum 07/11/2017  . Antepartum multigravida of advanced maternal age 09/11/2016    Assessment / Plan: 37 y.o. G2P0010 at 6974w1d here for IOL cHTN   Labor: FB and vaginal cytotec placed @0345  Fetal Wellbeing:  Cat I Pain Control:  IV Fentanyl  Anticipated MOD:  SVD  Sharyon CableRogers, Kingson Lohmeyer C, CNM 01/06/2018, 3:49 AM

## 2018-01-07 ENCOUNTER — Encounter (HOSPITAL_COMMUNITY)
Admission: RE | Disposition: A | Discharge status: Home / Self Care | Payer: Self-pay | Source: Home / Self Care | Attending: Obstetrics and Gynecology

## 2018-01-07 ENCOUNTER — Inpatient Hospital Stay (HOSPITAL_COMMUNITY): Payer: Medicaid Other | Admitting: Anesthesiology

## 2018-01-07 ENCOUNTER — Encounter (HOSPITAL_COMMUNITY): Payer: Self-pay

## 2018-01-07 DIAGNOSIS — O99214 Obesity complicating childbirth: Secondary | ICD-10-CM

## 2018-01-07 DIAGNOSIS — O1092 Unspecified pre-existing hypertension complicating childbirth: Secondary | ICD-10-CM

## 2018-01-07 DIAGNOSIS — O119 Pre-existing hypertension with pre-eclampsia, unspecified trimester: Secondary | ICD-10-CM

## 2018-01-07 DIAGNOSIS — Z98891 History of uterine scar from previous surgery: Secondary | ICD-10-CM

## 2018-01-07 DIAGNOSIS — O09523 Supervision of elderly multigravida, third trimester: Secondary | ICD-10-CM

## 2018-01-07 DIAGNOSIS — E669 Obesity, unspecified: Secondary | ICD-10-CM

## 2018-01-07 DIAGNOSIS — O1424 HELLP syndrome, complicating childbirth: Secondary | ICD-10-CM

## 2018-01-07 DIAGNOSIS — Z3A39 39 weeks gestation of pregnancy: Secondary | ICD-10-CM

## 2018-01-07 LAB — CBC
HCT: 35.3 % — ABNORMAL LOW (ref 36.0–46.0)
HCT: 34.4 % — ABNORMAL LOW (ref 36.0–46.0)
HEMOGLOBIN: 11.5 g/dL — AB (ref 12.0–15.0)
Hemoglobin: 11.8 g/dL — ABNORMAL LOW (ref 12.0–15.0)
MCH: 31.1 pg (ref 26.0–34.0)
MCH: 30.7 pg (ref 26.0–34.0)
MCHC: 33.4 g/dL (ref 30.0–36.0)
MCHC: 33.4 g/dL (ref 30.0–36.0)
MCV: 92.9 fL (ref 78.0–100.0)
MCV: 91.7 fL (ref 78.0–100.0)
PLATELETS: 100 10*3/uL — AB (ref 150–400)
PLATELETS: 190 10*3/uL (ref 150–400)
RBC: 3.8 MIL/uL — AB (ref 3.87–5.11)
RBC: 3.75 MIL/uL — AB (ref 3.87–5.11)
RDW: 13.9 % (ref 11.5–15.5)
RDW: 13.8 % (ref 11.5–15.5)
WBC: 20.2 10*3/uL — ABNORMAL HIGH (ref 4.0–10.5)
WBC: 24.5 10*3/uL — AB (ref 4.0–10.5)

## 2018-01-07 LAB — COMPREHENSIVE METABOLIC PANEL
ALK PHOS: 101 U/L (ref 38–126)
ALT: 10 U/L — ABNORMAL LOW (ref 14–54)
ALT: 11 U/L — AB (ref 14–54)
AST: 28 U/L (ref 15–41)
AST: 19 U/L (ref 15–41)
Albumin: 2.9 g/dL — ABNORMAL LOW (ref 3.5–5.0)
Albumin: 2.9 g/dL — ABNORMAL LOW (ref 3.5–5.0)
Alkaline Phosphatase: 105 U/L (ref 38–126)
Anion gap: 10 (ref 5–15)
Anion gap: 8 (ref 5–15)
BILIRUBIN TOTAL: 0.5 mg/dL (ref 0.3–1.2)
BUN: 7 mg/dL (ref 6–20)
BUN: 7 mg/dL (ref 6–20)
CALCIUM: 7.3 mg/dL — AB (ref 8.9–10.3)
CHLORIDE: 102 mmol/L (ref 101–111)
CHLORIDE: 103 mmol/L (ref 101–111)
CO2: 18 mmol/L — ABNORMAL LOW (ref 22–32)
CO2: 20 mmol/L — ABNORMAL LOW (ref 22–32)
CREATININE: 0.51 mg/dL (ref 0.44–1.00)
Calcium: 7.5 mg/dL — ABNORMAL LOW (ref 8.9–10.3)
Creatinine, Ser: 0.53 mg/dL (ref 0.44–1.00)
Glucose, Bld: 108 mg/dL — ABNORMAL HIGH (ref 65–99)
Glucose, Bld: 111 mg/dL — ABNORMAL HIGH (ref 65–99)
POTASSIUM: 4 mmol/L (ref 3.5–5.1)
Potassium: 3.8 mmol/L (ref 3.5–5.1)
Sodium: 130 mmol/L — ABNORMAL LOW (ref 135–145)
Sodium: 131 mmol/L — ABNORMAL LOW (ref 135–145)
TOTAL PROTEIN: 7.4 g/dL (ref 6.5–8.1)
Total Bilirubin: 0.5 mg/dL (ref 0.3–1.2)
Total Protein: 7.1 g/dL (ref 6.5–8.1)

## 2018-01-07 LAB — MAGNESIUM: MAGNESIUM: 4.7 mg/dL — AB (ref 1.7–2.4)

## 2018-01-07 SURGERY — Surgical Case
Anesthesia: Spinal

## 2018-01-07 MED ORDER — TETANUS-DIPHTH-ACELL PERTUSSIS 5-2.5-18.5 LF-MCG/0.5 IM SUSP: 0.5000 mL | Freq: Once | INTRAMUSCULAR | Status: DC

## 2018-01-07 MED ORDER — FENTANYL CITRATE (PF) 100 MCG/2ML IJ SOLN: 25.0000 ug | INTRAMUSCULAR | Status: DC | PRN

## 2018-01-07 MED ORDER — SCOPOLAMINE 1 MG/3DAYS TD PT72
MEDICATED_PATCH | TRANSDERMAL | Status: DC | PRN
  Administered 2018-01-07: 1 via TRANSDERMAL

## 2018-01-07 MED ORDER — WITCH HAZEL-GLYCERIN EX PADS: 1.0000 "application " | MEDICATED_PAD | CUTANEOUS | Status: DC | PRN

## 2018-01-07 MED ORDER — SENNOSIDES-DOCUSATE SODIUM 8.6-50 MG PO TABS: 2.0000 | ORAL_TABLET | Freq: Every evening | ORAL | Status: DC | PRN

## 2018-01-07 MED ORDER — SCOPOLAMINE 1 MG/3DAYS TD PT72
MEDICATED_PATCH | TRANSDERMAL | Status: AC
  Filled 2018-01-07: qty 1

## 2018-01-07 MED ORDER — MORPHINE SULFATE (PF) 0.5 MG/ML IJ SOLN
INTRAMUSCULAR | Status: DC | PRN
  Administered 2018-01-07: .2 mg via INTRATHECAL

## 2018-01-07 MED ORDER — OXYCODONE HCL 5 MG PO TABS: 5.0000 mg | ORAL_TABLET | ORAL | Status: DC | PRN

## 2018-01-07 MED ORDER — MAGNESIUM SULFATE 40 G IN LACTATED RINGERS - SIMPLE: 2.0000 g/h | INTRAVENOUS | Status: DC

## 2018-01-07 MED ORDER — FENTANYL CITRATE (PF) 100 MCG/2ML IJ SOLN
INTRAMUSCULAR | Status: AC
  Filled 2018-01-07: qty 2

## 2018-01-07 MED ORDER — ACETAMINOPHEN 325 MG PO TABS: 650.0000 mg | ORAL_TABLET | ORAL | Status: DC | PRN

## 2018-01-07 MED ORDER — OXYTOCIN 40 UNITS IN LACTATED RINGERS INFUSION - SIMPLE MED: 2.5000 [IU]/h | INTRAVENOUS | Status: AC

## 2018-01-07 MED ORDER — PHENYLEPHRINE 8 MG IN D5W 100 ML (0.08MG/ML) PREMIX OPTIME
INJECTION | INTRAVENOUS | Status: AC
  Filled 2018-01-07: qty 100

## 2018-01-07 MED ORDER — FENTANYL CITRATE (PF) 100 MCG/2ML IJ SOLN
INTRAMUSCULAR | Status: DC | PRN
  Administered 2018-01-07: 20 ug via INTRATHECAL

## 2018-01-07 MED ORDER — SOD CITRATE-CITRIC ACID 500-334 MG/5ML PO SOLN: 30.0000 mL | ORAL | Status: DC

## 2018-01-07 MED ORDER — ONDANSETRON HCL 4 MG/2ML IJ SOLN
INTRAMUSCULAR | Status: AC
  Filled 2018-01-07: qty 2

## 2018-01-07 MED ORDER — LACTATED RINGERS IV SOLN
INTRAVENOUS | Status: DC
  Administered 2018-01-07 – 2018-01-08 (×2): via INTRAVENOUS

## 2018-01-07 MED ORDER — DEXAMETHASONE SODIUM PHOSPHATE 10 MG/ML IJ SOLN
INTRAMUSCULAR | Status: DC | PRN
  Administered 2018-01-07: 10 mg via INTRAVENOUS

## 2018-01-07 MED ORDER — DIPHENHYDRAMINE HCL 25 MG PO CAPS: 25.0000 mg | ORAL_CAPSULE | Freq: Four times a day (QID) | ORAL | Status: DC | PRN

## 2018-01-07 MED ORDER — DEXTROSE 5 % IV SOLN
3.0000 g | INTRAVENOUS | Status: AC
  Administered 2018-01-07: 3 g via INTRAVENOUS
  Filled 2018-01-07: qty 3000

## 2018-01-07 MED ORDER — MORPHINE SULFATE (PF) 0.5 MG/ML IJ SOLN
INTRAMUSCULAR | Status: AC
  Filled 2018-01-07: qty 10

## 2018-01-07 MED ORDER — MENTHOL 3 MG MT LOZG: 1.0000 | LOZENGE | OROMUCOSAL | Status: DC | PRN

## 2018-01-07 MED ORDER — IBUPROFEN 600 MG PO TABS
600.0000 mg | ORAL_TABLET | Freq: Four times a day (QID) | ORAL | Status: DC | PRN
  Administered 2018-01-08 – 2018-01-09 (×3): 600 mg via ORAL
  Filled 2018-01-07 (×3): qty 1

## 2018-01-07 MED ORDER — SIMETHICONE 80 MG PO CHEW
80.0000 mg | CHEWABLE_TABLET | Freq: Three times a day (TID) | ORAL | Status: DC
  Administered 2018-01-08 – 2018-01-09 (×5): 80 mg via ORAL
  Filled 2018-01-07 (×5): qty 1

## 2018-01-07 MED ORDER — SODIUM CHLORIDE 0.9 % IR SOLN
Status: DC | PRN
  Administered 2018-01-07: 1

## 2018-01-07 MED ORDER — MAGNESIUM SULFATE 40 G IN LACTATED RINGERS - SIMPLE
2.0000 g/h | INTRAVENOUS | Status: AC
  Administered 2018-01-07: 2 g/h via INTRAVENOUS
  Filled 2018-01-07: qty 40
  Filled 2018-01-07: qty 500

## 2018-01-07 MED ORDER — SODIUM CHLORIDE 0.9 % IV SOLN
500.0000 mg | INTRAVENOUS | Status: AC
  Administered 2018-01-07: 500 mg via INTRAVENOUS
  Filled 2018-01-07: qty 500

## 2018-01-07 MED ORDER — PRENATAL MULTIVITAMIN CH
1.0000 | ORAL_TABLET | Freq: Every day | ORAL | Status: DC
  Administered 2018-01-08 – 2018-01-09 (×2): 1 via ORAL
  Filled 2018-01-07 (×2): qty 1

## 2018-01-07 MED ORDER — FAMOTIDINE IN NACL 20-0.9 MG/50ML-% IV SOLN
20.0000 mg | Freq: Once | INTRAVENOUS | Status: AC
  Administered 2018-01-07: 20 mg via INTRAVENOUS
  Filled 2018-01-07: qty 50

## 2018-01-07 MED ORDER — DIBUCAINE 1 % RE OINT: 1.0000 "application " | TOPICAL_OINTMENT | RECTAL | Status: DC | PRN

## 2018-01-07 MED ORDER — COCONUT OIL OIL
1.0000 "application " | TOPICAL_OIL | Status: DC | PRN
  Administered 2018-01-09: 1 via TOPICAL
  Filled 2018-01-07: qty 120

## 2018-01-07 MED ORDER — DEXAMETHASONE SODIUM PHOSPHATE 10 MG/ML IJ SOLN
INTRAMUSCULAR | Status: AC
Start: 2018-01-07 — End: ?
  Filled 2018-01-07: qty 1

## 2018-01-07 MED ORDER — OXYTOCIN 10 UNIT/ML IJ SOLN
INTRAMUSCULAR | Status: DC | PRN
  Administered 2018-01-07: 40 [IU] via INTRAVENOUS

## 2018-01-07 MED ORDER — AMLODIPINE BESYLATE 5 MG PO TABS
5.0000 mg | ORAL_TABLET | Freq: Every day | ORAL | Status: DC
  Administered 2018-01-08 – 2018-01-09 (×2): 5 mg via ORAL
  Filled 2018-01-07 (×2): qty 1

## 2018-01-07 MED ORDER — OXYTOCIN 10 UNIT/ML IJ SOLN
INTRAMUSCULAR | Status: AC
  Filled 2018-01-07: qty 4

## 2018-01-07 MED ORDER — MEPERIDINE HCL 25 MG/ML IJ SOLN: 6.2500 mg | INTRAMUSCULAR | Status: DC | PRN

## 2018-01-07 MED ORDER — POLYETHYLENE GLYCOL 3350 17 G PO PACK
17.0000 g | PACK | Freq: Every day | ORAL | Status: DC
  Administered 2018-01-08 – 2018-01-09 (×2): 17 g via ORAL
  Filled 2018-01-07 (×2): qty 1

## 2018-01-07 MED ORDER — PHENYLEPHRINE 8 MG IN D5W 100 ML (0.08MG/ML) PREMIX OPTIME
INJECTION | INTRAVENOUS | Status: DC | PRN
  Administered 2018-01-07: 20 ug/min via INTRAVENOUS

## 2018-01-07 MED ORDER — OXYTOCIN 40 UNITS IN LACTATED RINGERS INFUSION - SIMPLE MED: 1.0000 m[IU]/min | INTRAVENOUS | Status: DC

## 2018-01-07 MED ORDER — BUPIVACAINE IN DEXTROSE 0.75-8.25 % IT SOLN
INTRATHECAL | Status: DC | PRN
  Administered 2018-01-07: 1.7 mL via INTRATHECAL

## 2018-01-07 MED ORDER — DEXTROSE 5 % IV SOLN
INTRAVENOUS | Status: AC
  Filled 2018-01-07: qty 3000

## 2018-01-07 MED ORDER — OXYCODONE HCL 5 MG PO TABS
10.0000 mg | ORAL_TABLET | ORAL | Status: DC | PRN
  Administered 2018-01-08 – 2018-01-09 (×4): 10 mg via ORAL
  Filled 2018-01-07 (×4): qty 2

## 2018-01-07 MED ORDER — ONDANSETRON HCL 4 MG/2ML IJ SOLN
INTRAMUSCULAR | Status: DC | PRN
  Administered 2018-01-07: 4 mg via INTRAVENOUS

## 2018-01-07 SURGICAL SUPPLY — 35 items
BENZOIN TINCTURE PRP APPL 2/3 (GAUZE/BANDAGES/DRESSINGS) ×2 IMPLANT
CANISTER SUCT 3000ML PPV (MISCELLANEOUS) ×2 IMPLANT
CHLORAPREP W/TINT 26ML (MISCELLANEOUS) ×2 IMPLANT
DRESSING PREVENA PLUS CUSTOM (GAUZE/BANDAGES/DRESSINGS) ×1 IMPLANT
DRSG OPSITE POSTOP 4X10 (GAUZE/BANDAGES/DRESSINGS) ×2 IMPLANT
DRSG PREVENA PLUS CUSTOM (GAUZE/BANDAGES/DRESSINGS) ×2
ELECT REM PT RETURN 9FT ADLT (ELECTROSURGICAL) ×2
ELECTRODE REM PT RTRN 9FT ADLT (ELECTROSURGICAL) ×1 IMPLANT
EXTRACTOR VACUUM KIWI (MISCELLANEOUS) ×2 IMPLANT
GLOVE BIOGEL PI IND STRL 7.0 (GLOVE) ×2 IMPLANT
GLOVE BIOGEL PI IND STRL 7.5 (GLOVE) ×1 IMPLANT
GLOVE BIOGEL PI INDICATOR 7.0 (GLOVE) ×2
GLOVE BIOGEL PI INDICATOR 7.5 (GLOVE) ×1
GLOVE SKINSENSE NS SZ7.0 (GLOVE) ×1
GLOVE SKINSENSE STRL SZ7.0 (GLOVE) ×1 IMPLANT
GOWN STRL REUS W/ TWL LRG LVL3 (GOWN DISPOSABLE) ×2 IMPLANT
GOWN STRL REUS W/ TWL XL LVL3 (GOWN DISPOSABLE) ×1 IMPLANT
GOWN STRL REUS W/TWL LRG LVL3 (GOWN DISPOSABLE) ×2
GOWN STRL REUS W/TWL XL LVL3 (GOWN DISPOSABLE) ×1
HOVERMATT SINGLE USE (MISCELLANEOUS) ×2 IMPLANT
NS IRRIG 1000ML POUR BTL (IV SOLUTION) ×2 IMPLANT
PACK C SECTION WH (CUSTOM PROCEDURE TRAY) ×2 IMPLANT
PAD ABD 7.5X8 STRL (GAUZE/BANDAGES/DRESSINGS) ×2 IMPLANT
PAD OB MATERNITY 4.3X12.25 (PERSONAL CARE ITEMS) ×2 IMPLANT
PAD PREP 24X48 CUFFED NSTRL (MISCELLANEOUS) ×2 IMPLANT
PENCIL SMOKE EVAC W/HOLSTER (ELECTROSURGICAL) ×2 IMPLANT
STRIP CLOSURE SKIN 1/2X4 (GAUZE/BANDAGES/DRESSINGS) ×2 IMPLANT
SUT MNCRL 0 VIOLET CTX 36 (SUTURE) ×2 IMPLANT
SUT MON AB 4-0 PS1 27 (SUTURE) ×2 IMPLANT
SUT MONOCRYL 0 CTX 36 (SUTURE) ×2
SUT PLAIN 2 0 XLH (SUTURE) ×2 IMPLANT
SUT VIC AB 0 CT1 36 (SUTURE) ×4 IMPLANT
SUT VIC AB 3-0 CT1 27 (SUTURE) ×1
SUT VIC AB 3-0 CT1 TAPERPNT 27 (SUTURE) ×1 IMPLANT
TOWEL OR 17X24 6PK STRL BLUE (TOWEL DISPOSABLE) ×4 IMPLANT

## 2018-01-07 NOTE — Anesthesia Procedure Notes (Signed)
Spinal  Patient location during procedure: OR Start time: 01/07/2018 9:26 AM Staffing Anesthesiologist: Mal AmabileFoster, Michelangelo Rindfleisch, MD Performed: anesthesiologist  Preanesthetic Checklist Completed: patient identified, site marked, surgical consent, pre-op evaluation, timeout performed, IV checked, risks and benefits discussed and monitors and equipment checked Spinal Block Patient position: sitting Prep: site prepped and draped and DuraPrep Patient monitoring: heart rate, cardiac monitor, continuous pulse ox and blood pressure Approach: midline Location: L3-4 Injection technique: single-shot Needle Needle type: Tuohy and Spinocan  Needle gauge: 25 G Needle length: 9 cm Needle insertion depth: 10 cm Assessment Sensory level: T4 Additional Notes Attempted SAB with Pencan x 1 unsuccessful. 17ga Touhy using LOR with air technique. LOR at 10 cm. 25ga spinocan through epidural needle. CSF clear free flow, no heme or paresthesias. Patient tolerated procedure well. Adequate sensory level.

## 2018-01-07 NOTE — Addendum Note (Signed)
Addendum  created 01/07/18 1636 by Orlie PollenMerritt, Rolin Schult R, CRNA   Sign clinical note

## 2018-01-07 NOTE — Progress Notes (Addendum)
Labor Progress Note Shirl HarrisJennifer Gethers is a 37 y.o. G2P0010 at 5531w2d presented for IOL for CHTN with s/i pre-e  S:  Ctx are becoming stronger, having some heartburn.  O:  BP (!) 167/102   Pulse 84   Temp 99.9 F (37.7 C) (Oral)   Resp 16   Ht 5\' 8"  (1.727 m)   Wt (!) 307 lb 3.2 oz (139.3 kg)   LMP 03/28/2017 (Approximate)   SpO2 99%   BMI 46.71 kg/m  EFM: baseline 140 bpm/ mod variability/ + accels/ no decels  Toco: 2-4 MVU: 200 SVE: Dilation: 5 Effacement (%): 70 Cervical Position: Posterior Station: -3 Presentation: Vertex Exam by:: Fabian NovemberM Raiden Yearwood CNM Pitocin: 20 mu/min  A/P: 37 y.o. G2P0010 5931w2d  1. Labor: entering early active phase 2. FWB: Cat I 3. Pain: analgesia prn  Pepcid IV. Continue Pitocin. Labs normal.  Anticipate SVD.  Donette LarryMelanie Wayland Baik, CNM 12:36 AM

## 2018-01-07 NOTE — Anesthesia Postprocedure Evaluation (Signed)
Anesthesia Post Note  Patient: Shirl HarrisJennifer Fieldhouse  Procedure(s) Performed: CESAREAN SECTION (N/A )     Patient location during evaluation: [facility] Anesthesia Type: Spinal Level of consciousness: awake and alert Pain management: pain level controlled Vital Signs Assessment: post-procedure vital signs reviewed and stable Respiratory status: spontaneous breathing, nonlabored ventilation, respiratory function stable and patient connected to nasal cannula oxygen Cardiovascular status: blood pressure returned to baseline and stable Postop Assessment: no apparent nausea or vomiting Anesthetic complications: no    Last Vitals:  Vitals:   01/07/18 1508 01/07/18 1617  BP: (!) 143/77 140/70  Pulse: 71 71  Resp: 18 18  Temp: 36.7 C 36.9 C  SpO2: 98% 100%    Last Pain:  Vitals:   01/07/18 1617  TempSrc: Oral  PainSc:    Pain Goal: Patients Stated Pain Goal: 8 (01/06/18 0947)               Marrion CoyMERRITT,Aarushi Hemric

## 2018-01-07 NOTE — Progress Notes (Signed)
Felicia Brennan is a 37 y.o. G2P0010 at 25w2dadmitted for IOL for hypertension. Since admit the pressures have met severe criteria and she's being treated as superimposed Preeclampsia.  Subjective: Tolerating labor well. She has recently started breathing thru contractions a bit.  Objective: BP (!) 167/102   Pulse 84   Temp 99.9 F (37.7 C) (Oral)   Resp 16   Ht _0  (1.727 m)   Wt (!) 307 lb 3.2 oz (139.3 kg)   LMP 03/28/2017 (Approximate)   SpO2 99%   BMI 46.71 kg/m  I/O last 3 completed shifts: In: 7343.2 [P.O.:2800; I.V.:4543.2] Out: 6700 [Urine:6700] Total I/O In: 671 [P.O.:75; I.V.:596] Out: 750 [Urine:600; Stool:150]  FHT:  FHR: 140 bpm, variability: moderate,  accelerations:  Present,  decelerations:  Present a single variable decel after exam. UC:   regular, every 3 minutes SVE:   Dilation: 5 Effacement (%): 70 Station: -3 Exam by:: MColman CaterCNM Exam by JJonnie Kindat 1:15 shows not change in dilation, effaceemnt or descent Labs: Lab Results  Component Value Date   WBC 13.3 (H) 01/06/2018   HGB 12.2 01/06/2018   HCT 36.9 01/06/2018   MCV 92.7 01/06/2018   PLT 169 01/06/2018    Assessment / Plan: Protracted latent phase. Stable mother and baby.   Labor: Progressing normally Preeclampsia:  on magnesium sulfate Fetal Wellbeing:  Category I Pain Control:  Labor support without medications I/D:  n/a Anticipated MOD:  NSVD though lack of progress in 12 hours is a concern..Jonnie Kind6/24/2019, 1:15 AM

## 2018-01-07 NOTE — Anesthesia Preprocedure Evaluation (Signed)
Anesthesia Evaluation  Patient identified by MRN, date of birth, ID band Patient awake    Reviewed: Allergy & Precautions, NPO status , Patient's Chart, lab work & pertinent test results, reviewed documented beta blocker date and time   Airway Mallampati: III  TM Distance: >3 FB Neck ROM: Full    Dental no notable dental hx. (+) Teeth Intact   Pulmonary Current Smoker,    Pulmonary exam normal breath sounds clear to auscultation       Cardiovascular hypertension, Pt. on medications and Pt. on home beta blockers Normal cardiovascular exam Rhythm:Regular Rate:Normal     Neuro/Psych negative neurological ROS  negative psych ROS   GI/Hepatic Neg liver ROS, GERD  ,  Endo/Other  Morbid obesity  Renal/GU negative Renal ROS  negative genitourinary   Musculoskeletal negative musculoskeletal ROS (+)   Abdominal (+) + obese,   Peds  Hematology  (+) anemia , Thrombocytopenia- ? HELLP syndrome early   Anesthesia Other Findings   Reproductive/Obstetrics (+) Pregnancy cHTN with superimposed pre eclampsia                             Anesthesia Physical Anesthesia Plan  ASA: III and emergent  Anesthesia Plan: Spinal   Post-op Pain Management:    Induction:   PONV Risk Score and Plan: 3 and Scopolamine patch - Pre-op, Dexamethasone, Ondansetron and Treatment may vary due to age or medical condition  Airway Management Planned: Natural Airway  Additional Equipment:   Intra-op Plan:   Post-operative Plan:   Informed Consent: I have reviewed the patients History and Physical, chart, labs and discussed the procedure including the risks, benefits and alternatives for the proposed anesthesia with the patient or authorized representative who has indicated his/her understanding and acceptance.   Dental advisory given  Plan Discussed with: Anesthesiologist, CRNA and Surgeon  Anesthesia Plan  Comments:         Anesthesia Quick Evaluation

## 2018-01-07 NOTE — Anesthesia Postprocedure Evaluation (Signed)
Anesthesia Post Note  Patient: Felicia HarrisJennifer Brennan  Procedure(s) Performed: CESAREAN SECTION (N/A )     Patient location during evaluation: PACU Anesthesia Type: Spinal Level of consciousness: oriented and awake and alert Pain management: pain level controlled Vital Signs Assessment: post-procedure vital signs reviewed and stable Respiratory status: spontaneous breathing, respiratory function stable and nonlabored ventilation Cardiovascular status: blood pressure returned to baseline and stable Postop Assessment: no headache, no backache, no apparent nausea or vomiting, patient able to bend at knees and spinal receding Anesthetic complications: no    Last Vitals:  Vitals:   01/07/18 1145 01/07/18 1204  BP: 135/70 139/76  Pulse: 71 72  Resp: 13 18  Temp: 37.1 C 37.1 C  SpO2: 100% 100%    Last Pain:  Vitals:   01/07/18 1204  TempSrc: Oral  PainSc:    Pain Goal: Patients Stated Pain Goal: 8 (01/06/18 0947)               Carrisa Keller A.

## 2018-01-07 NOTE — Op Note (Signed)
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 delivered   PROCEDURE: primary low transverse cesarean section via pfannenstiel skin incision with double layer uterine closure  SURGEON: Surgeon(s) and Role:    Vergie Living* Nathalya Wolanski, Billey Goslingharlie, MD - Primary  ASSISTANT: Nolene EbbsJulie Degele, MD (Fellow)  ANESTHESIA: spinal  ESTIMATED BLOOD LOSS: 564mL  DRAINS: 75mL UOP via indwelling foley  TOTAL IV FLUIDS: 500mL crystalloid  VTE PROPHYLAXIS: SCDs to bilateral lower extremities  ANTIBIOTICS: three grams of Cefazolin and azithromycin 500mg  IV x 1were given, within 1 hour of skin incision.  SPECIMENS: placenta to pathology  COMPLICATIONS: none  INDICATIONS: 4-5cm with rupture of membranes on pitocin for 18 hours  FINDINGS: No intra-abdominal adhesions were noted. Grossly normal uterus, tubes and ovaries. Clear amniotic fluid, cephalic female infant, weight 2705gm, APGARs 7/9, intact placenta.  PROCEDURE IN DETAIL: The patient was taken to the operating room where anesthesia was administered and normal fetal heart tones were confirmed. She was then prepped and draped in the normal fashion in the dorsal supine position with a leftward tilt and the Traxi pannus retractor used; her magnesium continued to infuse during the case.  After a time out was performed, a pfannensteil skin incision was made with the scalpel and carried through to the underlying layer of fascia. The fascia was then incised at the midline and this incision was extended laterally with the mayo scissors. Attention was turned to the superior aspect of the fascial incision which was grasped with the kocher clamps x 2, tented up and the rectus muscles were dissected off with the bovie. In a similar fashion the inferior aspect of the fascial incision was  grasped with the kocher clamps, tented up and the rectus muscles dissected off with the mayo scissors. The rectus muscles were then separated in the midline and the peritoneum was entered bluntly. The bladder blade was inserted and the vesicouterine peritoneum was identified, tented up and entered with the metzenbaum scissors. This incision was extended laterally and the bladder flap was created digitally. The bladder blade was reinserted.  A low transverse hysterotomy was made with the scalpel until the endometrial cavity was breached and the amniotic sac ruptured yielding clear amniotic fluid. This incision was extended bluntly and the infant's head, shoulders and body were delivered atraumatically.The cord was clamped x 2 and cut, and the infant was handed to the awaiting pediatricians, after delayed cord clamping was done for approximately 30 seconds but the infant wasn't very vigorous so the cord was clamped, cut and handed to the awaiting peds team.  The placenta was then gradually expressed from the uterus and then the uterus was exteriorized and cleared of all clots and debris. The hysterotomy was repaired with a running suture of 1-0 monocyrl. A second imbricating layer of 1-0 monocryl suture was then placed to achieve excellent hemostasis.   The uterus and adnexa were then returned to the abdomen, and the hysterotomy and all operative sites were reinspected and excellent hemostasis was noted after irrigation and suction of the abdomen with warm saline.  The peritoneum was closed with a running stitch of 3-0 Vicryl. The fascia was reapproximated with 0 Vicryl in a simple running fashion bilaterally. The subcutaneous layer was then reapproximated with interrupted sutures of 2-0 plain gut, and the skin was then closed with 4-0 monocryl, in a subcuticular  fashion. A prevena wound device was then placed.   The patient  tolerated the procedure well. Sponge, lap, needle, and instrument counts were correct  x 2. The patient was transferred to the recovery room awake, alert and breathing independently in stable condition.  Cornelia Copa MD Attending Center for [facility])

## 2018-01-07 NOTE — Progress Notes (Signed)
L&D Note  01/07/2018 - 8:55 AM  37 y.o. G2P0010 350w2d. Pregnancy complicated by BMI 47, cHTN, trich  Patient Active Problem List   Diagnosis Date Noted  . Encounter for induction of labor 01/05/2018  . Chronic hypertension in pregnancy 01/05/2018  . Abnormal quad screen 07/27/2017  . Trichomonal vaginitis in pregnancy 07/25/2017  . Abnormal Pap smear of cervix 07/25/2017  . Supervision of high risk pregnancy, antepartum 07/11/2017  . Chronic hypertension during pregnancy, antepartum 07/11/2017  . Antepartum multigravida of advanced maternal age 85/26/2018    Felicia Brennan is admitted for IOL for cHTN with superimposed severe pre-x dx based on BP during IOL   Subjective:  No s/s of pre-eclampsia  Objective:   Vitals:   01/07/18 0637 01/07/18 0658 01/07/18 0747 01/07/18 0822  BP: (!) 157/79 (!) 154/81 134/66 (!) 136/58  Pulse: 65 67 64 79  Resp: 16 16 18    Temp:   98.8 F (37.1 C)   TempSrc:   Oral   SpO2:      Weight:      Height:        Current Vital Signs 24h Vital Sign Ranges  T 98.8 F (37.1 C) Temp  Avg: 99.1 F (37.3 C)  Min: 98.2 F (36.8 C)  Max: 99.9 F (37.7 C)  BP (!) 136/58 BP  Min: 122/73  Max: 186/99  HR 79 Pulse  Avg: 78.6  Min: 64  Max: 91  RR 18 Resp  Avg: 17.6  Min: 16  Max: 20  SaO2 99 %   SpO2  Avg: 99 %  Min: 99 %  Max: 99 %       24 Hour I/O Current Shift I/O  Time Ins Outs 06/23 0701 - 06/24 0700 In: 4504 [P.O.:875; I.V.:3629] Out: 5175 [Urine:4975] 06/24 0701 - 06/24 1900 In: 154.5 [I.V.:154.5] Out: 200 [Urine:200]  UOP: >18800mL/hr  FHR: 140 baseline, +accels, no decel, mod variability Toco: q2246m Gen: NAD SVE: 4-5cm per Dr. Nira Retortegele  Labs:  Recent Labs  Lab 01/05/18 1444 01/06/18 2058 01/07/18 0723  WBC 10.0 13.3* 20.2*  HGB 10.7* 12.2 11.8*  HCT 31.8* 36.9 35.3*  PLT 186 169 100*   Recent Labs  Lab 01/03/18 0957 01/05/18 1444 01/06/18 2058  NA 136 135 132*  K 4.3 3.7 3.8  CL 103 106 104  CO2 17* 20* 18*  BUN  14 11 6   CREATININE 0.63 0.62 0.55  CALCIUM 9.5 8.6* 7.8*  PROT 6.9 6.9 7.1  BILITOT <0.2 0.2* 0.4  ALKPHOS 107 99 108  ALT 7 10* 11*  AST 15 17 19   GLUCOSE 82 99 96    Medications Current Facility-Administered Medications  Medication Dose Route Frequency Provider Last Rate Last Dose  . acetaminophen (TYLENOL) tablet 650 mg  650 mg Oral Q4H PRN Arabella MerlesShaw, Kimberly D, CNM   650 mg at 01/06/18 [id number]  . fentaNYL (SUBLIMAZE) injection 100 mcg  100 mcg Intravenous Q1H PRN Arabella MerlesShaw, Kimberly D, CNM   100 mcg at 01/07/18 0540  . labetalol (NORMODYNE) tablet 400 mg  400 mg Oral BID Anyanwu, Ugonna A, MD   400 mg at 01/06/18 2216  . lactated ringers infusion 500-1,000 mL  500-1,000 mL Intravenous PRN Arabella MerlesShaw, Kimberly D, CNM      . lactated ringers infusion   Intravenous Continuous Arabella MerlesShaw, Kimberly D, CNM 125 mL/hr at 01/07/18 0242    . lidocaine (PF) (XYLOCAINE) 1 % injection 30 mL  30 mL Subcutaneous PRN Arabella MerlesShaw, Kimberly D, CNM      .  magnesium sulfate 40 grams in LR 500 mL OB infusion  2 g/hr Intravenous Continuous [place] Bing, MD 37.5 mL/hr at 01/07/18 0850 3 g/hr at 01/07/18 0850  . ondansetron (ZOFRAN) injection 4 mg  4 mg Intravenous Q6H PRN Cam Hai D, CNM   4 mg at 01/06/18 1812  . oxyCODONE-acetaminophen (PERCOCET/ROXICET) 5-325 MG per tablet 1 tablet  1 tablet Oral Q4H PRN Arabella Merles, CNM      . oxyCODONE-acetaminophen (PERCOCET/ROXICET) 5-325 MG per tablet 2 tablet  2 tablet Oral Q4H PRN Arabella Merles, CNM      . oxytocin (PITOCIN) IV BOLUS FROM BAG  500 mL Intravenous Once Arabella Merles, CNM      . oxytocin (PITOCIN) IV infusion 40 units in LR 1000 mL - Premix  2.5 Units/hr Intravenous Continuous Arabella Merles, CNM      . oxytocin (PITOCIN) IV infusion 40 units in LR 1000 mL - Premix  1-40 milli-units/min Intravenous Titrated Tilda Burrow, MD   Stopped at 01/07/18 [phone]  . promethazine (PHENERGAN) injection 12.5 mg  12.5 mg Intravenous Q6H PRN Donette Larry, CNM   12.5  mg at 01/06/18 2043  . sodium citrate-citric acid (ORACIT) solution 30 mL  30 mL Oral Q2H PRN Arabella Merles, CNM      . terbutaline (BRETHINE) injection 0.25 mg  0.25 mg Subcutaneous Once PRN Arabella Merles, CNM      . zolpidem (AMBIEN) tablet 5 mg  5 mg Oral QHS PRN Arabella Merles, CNM        Assessment & Plan:  Pt unchanged *IUP: category I *IOL: has 4-5cm since AROM yesterday at 1300. Pt now with HELLP based on plts with cmp pending. Recommend proceeding with c-section due to arrest of dilation in conjunction with HELLP syndrome. Pt amenable to proceeding with c-section. Will do azithro in addition to ancef.  *HELLP superimposed on cHTN: will increase Mg to 3/hr x 1 hr and then to 2/hr based on Mg level of 4.7 *ID: tx with flagyl on admission *GBS: neg *Analgesia: will have to d/w anesthesia re: if regional is okay.   Cornelia Copa MD Attending Center for [facility])

## 2018-01-07 NOTE — Transfer of Care (Signed)
Immediate Anesthesia Transfer of Care Note  Patient: Felicia HarrisJennifer Luster  Procedure(s) Performed: CESAREAN SECTION (N/A )  Patient Location: PACU  Anesthesia Type:Spinal  Level of Consciousness: awake, alert  and oriented  Airway & Oxygen Therapy: Patient Spontanous Breathing  Post-op Assessment: Report given to RN and Post -op Vital signs reviewed and stable  Post vital signs: Reviewed and stable  Last Vitals:  Vitals Value Taken Time  BP 145/123 01/07/2018 10:50 AM  Temp    Pulse 73 01/07/2018 10:54 AM  Resp    SpO2 99 % 01/07/2018 10:54 AM  Vitals shown include unvalidated device data.  Last Pain:  Vitals:   01/07/18 0822  TempSrc:   PainSc: 0-No pain      Patients Stated Pain Goal: 8 (01/06/18 0947)  Complications: No apparent anesthesia complications

## 2018-01-07 NOTE — Progress Notes (Signed)
Felicia HarrisJennifer Brennan is a 37 y.o. G2P0010 at 359w2d by LMP admitted for induction of labor due to Hypertension. On Mag Sulfate for severe features (bp).  Subjective: Pt has had optimal contractions for 12 hrs, tolerated well by pt while sitting upright most of labor. Pt becoming tired, discouraged.   Objective: BP (!) 154/81   Pulse 67   Temp 98.8 F (37.1 C) (Oral)   Resp 16   Ht 5\' 8"  (1.727 m)   Wt (!) 307 lb 3.2 oz (139.3 kg)   LMP 03/28/2017 (Approximate)   SpO2 99%   BMI 46.71 kg/m  I/O last 3 completed shifts: In: 7215 [P.O.:2555; I.V.:4660] Out: 1610 [RUEAV:[id number] [Urine:6475; Emesis/NG output:50; Stool:150] No intake/output data recorded.  FHT:  FHR: 140 bpm, variability: minimal ,  accelerations:  Present,  decelerations:  Absent UC:   regular, every 2.5 minutes SVE:   Dilation: 5 Effacement (%): 70 Station: -3 Exam by:: Dr. Emelda Brennan exam at [facility]  Labs: Lab Results  Component Value Date   WBC 13.3 (H) 01/06/2018   HGB 12.2 01/06/2018   HCT 36.9 01/06/2018   MCV 92.7 01/06/2018   PLT 169 01/06/2018    Assessment / Plan: Protracted latent phase  Labor: has been optimal x 12 hours.  Preeclampsia:  on magnesium sulfate, no signs or symptoms of toxicity and mag, PEC labs being repeated. Mag level 4.6 at 10 pm Fetal Wellbeing:  Category I Pain Control:  Labor support without medications I/D:  . Anticipated MOD:  poor progress, will give pt 2 hr rest. Restart by 8.  Felicia BurrowJohn V Lindon Brennan 01/07/2018, 7:03 AM

## 2018-01-08 ENCOUNTER — Encounter (HOSPITAL_COMMUNITY): Payer: Self-pay | Admitting: Obstetrics and Gynecology

## 2018-01-08 LAB — COMPREHENSIVE METABOLIC PANEL
ALBUMIN: 2.6 g/dL — AB (ref 3.5–5.0)
ALT: 9 U/L (ref 0–44)
ANION GAP: 8 (ref 5–15)
AST: 14 U/L — ABNORMAL LOW (ref 15–41)
Alkaline Phosphatase: 80 U/L (ref 38–126)
BUN: 13 mg/dL (ref 6–20)
CHLORIDE: 101 mmol/L (ref 98–111)
CO2: 20 mmol/L — AB (ref 22–32)
CREATININE: 0.55 mg/dL (ref 0.44–1.00)
Calcium: 6.8 mg/dL — ABNORMAL LOW (ref 8.9–10.3)
GFR calc non Af Amer: >60 mL/min (ref >60)
GLUCOSE: 88 mg/dL (ref 70–99)
Potassium: 3.5 mmol/L (ref 3.5–5.1)
SODIUM: 129 mmol/L — AB (ref 135–145)
Total Bilirubin: 0.2 mg/dL — ABNORMAL LOW (ref 0.3–1.2)
Total Protein: 6.5 g/dL (ref 6.5–8.1)

## 2018-01-08 LAB — CBC
HCT: 29.1 % — ABNORMAL LOW (ref 36.0–46.0)
HEMOGLOBIN: 9.8 g/dL — AB (ref 12.0–15.0)
MCH: 30.6 pg (ref 26.0–34.0)
MCHC: 33.7 g/dL (ref 30.0–36.0)
MCV: 90.9 fL (ref 78.0–100.0)
PLATELETS: 184 10*3/uL (ref 150–400)
RBC: 3.2 MIL/uL — AB (ref 3.87–5.11)
RDW: 13.8 % (ref 11.5–15.5)
WBC: 21.7 10*3/uL — AB (ref 4.0–10.5)

## 2018-01-08 NOTE — Lactation Note (Addendum)
This note was copied from a baby's chart. Lactation Consultation Note  Patient Name: Felicia Shirl HarrisJennifer Fournier ZOXWR'UToday's Date: 01/08/2018 Reason for consult: Initial assessment;Term;Primapara;1st time breastfeeding;Difficult latch;Infant < 6lbs;Infant weight loss. Mom reports several breast changes with pregnancy.  Baby is 6728 hours old, having challenges latching due to size of breast and location of nipple.  LC assisted latching STS/ left breast / football/ and depth achieved eventually, / multiple swallows/ but on/ off pattern.  Latch score 7. LC reviewed hand expressing, steady flow. And enc mom to hand express before feeding and pumping.  Baby content after feeding and mom was comfortable with latch.  Mom seems committed to breast feed and provide breast milk for baby.  Has a DEBP and hand pump, also active with [facility]IC / [place].  LC reviewed setup of the DEBP/ and mom using the #27 F for pumping an dit is comfortable.  Mother informed of post-discharge support and given phone number to the lactation department, including services for phone call assistance; out-patient appointments; and breastfeeding support group. List of other breastfeeding resources in the community given in the handout. Encouraged mother to call for problems or concerns related to breastfeeding.  Maternal Data Has patient been taught Hand Expression?: Yes Does the patient have breastfeeding experience prior to this delivery?: No  Feeding Feeding Type: Breast Fed Length of feed: 12 min(on and off pattern with multiple swallows )  LATCH Score Latch: Repeated attempts needed to sustain latch, nipple held in mouth throughout feeding, stimulation needed to elicit sucking reflex.  Audible Swallowing: Spontaneous and intermittent  Type of Nipple: Everted at rest and after stimulation  Comfort (Breast/Nipple): Soft / non-tender  Hold (Positioning): Full assist, staff holds infant at breast  LATCH Score:  7  Interventions Interventions: Breast feeding basics reviewed;Assisted with latch;Skin to skin;Breast massage;Hand express;Breast compression;Adjust position;Support pillows;Position options;DEBP  Lactation Tools Discussed/Used Tools: Pump Breast pump type: Double-Electric Breast Pump WIC Program: Yes Pump Review: Setup, frequency, and cleaning(pump already set up / LC reviewed ) Initiated by:: by RN    Consult Status Consult Status: Follow-up Date: 01/09/18 Follow-up type: In-patient    Matilde SprangMargaret Ann Ronette Hank 01/08/2018, 2:45 PM

## 2018-01-08 NOTE — Progress Notes (Signed)
Subjective: Postpartum Day 1: Cesarean Delivery Patient reports feeling well with some incisional pain. She denies chest pain, shortness of breath, lightheadedness/dizziness    Objective: Vital signs in last 24 hours: Temp:  [98 F (36.7 C)-99.3 F (37.4 C)] 99.3 F (37.4 C) (06/25 0825) Pulse Rate:  [65-86] 86 (06/25 0825) Resp:  [13-19] 18 (06/25 0825) BP: (93-153)/(50-78) 131/77 (06/25 0825) SpO2:  [98 %-100 %] 100 % (06/25 0825)  Physical Exam:  General: alert, cooperative and no distress Lochia: appropriate Uterine Fundus: firm Incision: wound vac in place and intact DVT Evaluation: No evidence of DVT seen on physical exam.  Recent Labs    01/07/18 1406 01/08/18 0659  HGB 11.5* 9.8*  HCT 34.4* 29.1*    Assessment/Plan: Status post Cesarean section. Doing well postoperatively.  Continue magnesium sulfate for 24 hours following delivery for seizure prophylaxis Continue Norvasc Continue routine post op care.  Felicia Brennan 01/08/2018, 9:40 AM

## 2018-01-09 ENCOUNTER — Other Ambulatory Visit: Payer: Medicaid Other

## 2018-01-09 ENCOUNTER — Encounter: Payer: Medicaid Other | Admitting: Obstetrics & Gynecology

## 2018-01-09 MED ORDER — AMLODIPINE BESYLATE 10 MG PO TABS: 10.0000 mg | ORAL_TABLET | Freq: Every day | ORAL | 0 refills | Status: DC

## 2018-01-09 MED ORDER — OXYCODONE-ACETAMINOPHEN 5-325 MG PO TABS: 1.0000 | ORAL_TABLET | Freq: Four times a day (QID) | ORAL | 0 refills | Status: DC | PRN

## 2018-01-09 MED ORDER — IBUPROFEN 600 MG PO TABS: 600.0000 mg | ORAL_TABLET | Freq: Four times a day (QID) | ORAL | 3 refills | Status: DC | PRN

## 2018-01-09 NOTE — Progress Notes (Signed)
CSW received consult for hx of marijuana use.  Referral was screened out due to the following: ~MOB had no documented substance use in prenatal record. ~MOB had no positive drug screens during prenatal care.  Please consult CSW if current concerns arise or by MOB's request.  CSW will monitor CDS results and make report to Child Protective Services if warranted.   

## 2018-01-09 NOTE — Lactation Note (Addendum)
This note was copied from a baby's chart. Lactation Consultation Note  Patient Name: Felicia Brennan [id number] Date: 01/09/2018   Mom was noted to be pumping only 1 breast & using size 27 flanges. Mom's nipple diameter was checked & size 24 flanges seem to be more appropriate; coconut oil also provided to lubricate flanges.  I discussed need to pump both breasts at the same time.  Mom is giving bottle & then offering breast. Mom commented that she is letting infant eat (both with bottle & breast) until she is done. Volume parameters based on day of life were provided, but with an emphasis on feeding "Felicia Brennan" as much as she desires.   Mom was shown how to assemble & use hand pump (double & single-mode) that was included in pump kit. Mom also has a DEBP at home.   I encouraged Mom to come to our [facility] on Tues at 11am for pre- & post-weights. I also gave her our Lactation number if needed.   Mom is on amlodipine (L3).   1208: I reentered room & asked Mom if she'd like me to observe "Felicia Brennan" breast feed so that we could possibly feed at the breast first & then offer a bottle. Mom declined, saying she likes how infant is being fed currently. Felicia Brennan [facility] 01/09/2018, 12:01 PM

## 2018-01-09 NOTE — Discharge Instructions (Signed)

## 2018-01-09 NOTE — Progress Notes (Signed)
Pt teaching complete  Family and friends with pt

## 2018-01-09 NOTE — Discharge Summary (Signed)
OB Discharge Summary     Patient Name: Felicia Brennan DOB: 05/20/1981 MRN: [id number]  Date of admission: 01/05/2018 Delivering MD: [place] Bing   Date of discharge: 01/09/2018  Admitting diagnosis: induction Intrauterine pregnancy: [redacted]w[redacted]d     Secondary diagnosis:  Principal Problem:   Chronic hypertension with superimposed preeclampsia Active Problems:   Encounter for induction of labor   Chronic hypertension in pregnancy   Status post primary low transverse cesarean section  Additional problems:HELLP syndrome     Discharge diagnosis: Term Pregnancy Delivered and CHTN with superimposed preeclampsia                                                                                                Augmentation: AROM and Pitocin  Hospital course:  Induction of Labor With Cesarean Section  37 y.o. yo G2P1011 at [redacted]w[redacted]d was admitted to the hospital 01/05/2018 for induction of labor. Patient had a labor course significant for [facility] with superimposed preeclampsia and development of HELLP. She received magnesium sulfate for seizure prophylaxis. The patient went for cesarean section due to Arrest of Dilation, and delivered a Viable infant,01/07/2018  Membrane Rupture Time/Date: 1:09 PM ,01/06/2018   Details of operation can be found in separate operative Note.  Patient had an uncomplicated postpartum course. She received magnesium sulfate for seizure prophylaxis for 24 hours following delivery. She was started on Norvasc. She is ambulating, tolerating a regular diet, passing flatus, and urinating well.  Patient is discharged home in stable condition on 01/09/18. Wound care and discharge instructions reviewed with the patient                                    Physical exam  Vitals:   01/08/18 1950 01/09/18 0358 01/09/18 0400 01/09/18 0809  BP: (!) 154/83 (!) 160/82 (!) 152/77 (!) 154/85  Pulse: 70 90 87 76  Resp: 17   18  Temp: 98.1 F (36.7 C) 99.4 F (37.4 C)  98.5 F (36.9 C)   TempSrc: Oral Oral    SpO2: 100% 100%  99%  Weight:      Height:       General: alert, cooperative and no distress Lochia: appropriate Uterine Fundus: firm Incision: provena wound vac in place and intact DVT Evaluation: No evidence of DVT seen on physical exam. Labs: Lab Results  Component Value Date   WBC 21.7 (H) 01/08/2018   HGB 9.8 (L) 01/08/2018   HCT 29.1 (L) 01/08/2018   MCV 90.9 01/08/2018   PLT 184 01/08/2018   CMP Latest Ref Rng & Units 01/08/2018  Glucose 70 - 99 mg/dL 88  BUN 6 - 20 mg/dL 13  Creatinine 2.53 - 6.64 mg/dL 4.03  Sodium 474 - 259 mmol/L 129(L)  Potassium 3.5 - 5.1 mmol/L 3.5  Chloride 98 - 111 mmol/L 101  CO2 22 - 32 mmol/L 20(L)  Calcium 8.9 - 10.3 mg/dL 5.6(L)  Total Protein 6.5 - 8.1 g/dL 6.5  Total Bilirubin 0.3 - 1.2 mg/dL 8.7(F)  Alkaline Phos 38 - 126 U/L  80  AST 15 - 41 U/L 14(L)  ALT 0 - 44 U/L 9    Discharge instruction: per After Visit Summary and "Baby and Me Booklet".  After visit meds:  Allergies as of 01/09/2018      Reactions   Pistachio Nut (diagnostic) Itching, Swelling   Throat itching and a little swelling      Medication List    STOP taking these medications   aspirin 81 MG tablet   calcium carbonate 500 MG chewable tablet Commonly known as:  TUMS - dosed in mg elemental calcium   labetalol 200 MG tablet Commonly known as:  NORMODYNE   metroNIDAZOLE 500 MG tablet Commonly known as:  FLAGYL     TAKE these medications   amLODipine 10 MG tablet Commonly known as:  NORVASC Take 1 tablet (10 mg total) by mouth daily.   ibuprofen 600 MG tablet Commonly known as:  ADVIL,MOTRIN Take 1 tablet (600 mg total) by mouth every 6 (six) hours as needed for headache, mild pain or cramping.   oxyCODONE-acetaminophen 5-325 MG tablet Commonly known as:  PERCOCET/ROXICET Take 1-2 tablets by mouth every 6 (six) hours as needed.   Prenatal Vitamins 0.8 MG tablet Take 1 tablet by mouth daily.       Diet: low salt  diet  Activity: Advance as tolerated. Pelvic rest for 6 weeks.   Outpatient follow up: 1 week Follow up Appt:No future appointments. Follow up Visit:No follow-ups on file.  Postpartum contraception: Undecided  Newborn Data: Live born female  Birth Weight: 5 lb 15.4 oz (2705 g) APGAR: 7, 9  Newborn Delivery   Birth date/time:  01/07/2018 09:52:00 Delivery type:  C-Section, Low Transverse C-section categorization:  Primary     Baby Feeding: Breast Disposition:home with mother   01/09/2018 Catalina AntiguaPeggy Samaria Anes, MD

## 2018-01-11 ENCOUNTER — Encounter: Payer: Self-pay | Admitting: Obstetrics and Gynecology

## 2018-01-14 ENCOUNTER — Encounter: Payer: Self-pay | Admitting: Obstetrics and Gynecology

## 2018-01-15 ENCOUNTER — Encounter: Payer: Self-pay | Admitting: Obstetrics and Gynecology

## 2018-01-15 ENCOUNTER — Ambulatory Visit: Payer: Medicaid Other

## 2018-01-15 ENCOUNTER — Other Ambulatory Visit: Payer: Self-pay

## 2018-01-15 MED ORDER — METRONIDAZOLE 500 MG PO TABS: ORAL_TABLET | ORAL | 0 refills | Status: DC

## 2018-01-15 NOTE — Progress Notes (Signed)
Flagyl sent to pharmacy for trich treatment per Dr Shawnie PonsPratt.  Informed patient.

## 2018-01-21 ENCOUNTER — Encounter (HOSPITAL_COMMUNITY): Payer: Self-pay

## 2018-01-21 ENCOUNTER — Inpatient Hospital Stay (HOSPITAL_COMMUNITY)
Admission: AD | Admit: 2018-01-21 | Discharge: 2018-01-21 | Disposition: A | Discharge status: Home / Self Care | Payer: Medicaid Other | Source: Ambulatory Visit | Attending: Obstetrics and Gynecology | Admitting: Obstetrics and Gynecology

## 2018-01-21 ENCOUNTER — Other Ambulatory Visit: Payer: Self-pay

## 2018-01-21 DIAGNOSIS — I10 Essential (primary) hypertension: Secondary | ICD-10-CM

## 2018-01-21 DIAGNOSIS — O1003 Pre-existing essential hypertension complicating the puerperium: Secondary | ICD-10-CM | POA: Insufficient documentation

## 2018-01-21 DIAGNOSIS — R030 Elevated blood-pressure reading, without diagnosis of hypertension: Secondary | ICD-10-CM | POA: Diagnosis present

## 2018-01-21 LAB — CBC
HEMATOCRIT: 36.4 % (ref 36.0–46.0)
HEMOGLOBIN: 12 g/dL (ref 12.0–15.0)
MCH: 30.5 pg (ref 26.0–34.0)
MCHC: 33 g/dL (ref 30.0–36.0)
MCV: 92.4 fL (ref 78.0–100.0)
Platelets: 357 10*3/uL (ref 150–400)
RBC: 3.94 MIL/uL (ref 3.87–5.11)
RDW: 13 % (ref 11.5–15.5)
WBC: 6 10*3/uL (ref 4.0–10.5)

## 2018-01-21 LAB — COMPREHENSIVE METABOLIC PANEL
ALT: 11 U/L (ref 0–44)
AST: 18 U/L (ref 15–41)
Albumin: 3.4 g/dL — ABNORMAL LOW (ref 3.5–5.0)
Alkaline Phosphatase: 68 U/L (ref 38–126)
Anion gap: 9 (ref 5–15)
BILIRUBIN TOTAL: 0.3 mg/dL (ref 0.3–1.2)
BUN: 14 mg/dL (ref 6–20)
CALCIUM: 8.9 mg/dL (ref 8.9–10.3)
CO2: 22 mmol/L (ref 22–32)
CREATININE: 0.73 mg/dL (ref 0.44–1.00)
Chloride: 105 mmol/L (ref 98–111)
GFR calc Af Amer: >60 mL/min (ref >60)
GFR calc non Af Amer: >60 mL/min (ref >60)
Glucose, Bld: 90 mg/dL (ref 70–99)
Potassium: 3.4 mmol/L — ABNORMAL LOW (ref 3.5–5.1)
SODIUM: 136 mmol/L (ref 135–145)
TOTAL PROTEIN: 7.4 g/dL (ref 6.5–8.1)

## 2018-01-21 LAB — PROTEIN / CREATININE RATIO, URINE
Creatinine, Urine: 272 mg/dL
PROTEIN CREATININE RATIO: 0.06 mg/mg{creat} (ref 0.00–0.15)
Total Protein, Urine: 15 mg/dL

## 2018-01-21 MED ORDER — NIFEDIPINE ER OSMOTIC RELEASE 30 MG PO TB24: 30.0000 mg | ORAL_TABLET | Freq: Every day | ORAL | 1 refills | Status: DC

## 2018-01-21 NOTE — Discharge Instructions (Signed)
-Return to [facility] anytime Friday morning, July 12 for blood pressure check.  -Return to MAU if you develop sudden HA unrelieved by water, rest, eating and tylenol.   DASH Eating Plan DASH stands for "Dietary Approaches to Stop Hypertension." The DASH eating plan is a healthy eating plan that has been shown to reduce high blood pressure (hypertension). It may also reduce your risk for type 2 diabetes, heart disease, and stroke. The DASH eating plan may also help with weight loss. What are tips for following this plan? General guidelines  Avoid eating more than 2,300 mg (milligrams) of salt (sodium) a day. If you have hypertension, you may need to reduce your sodium intake to 1,500 mg a day.  Limit alcohol intake to no more than 1 drink a day for nonpregnant women and 2 drinks a day for men. One drink equals 12 oz of beer, 5 oz of wine, or 1 oz of hard liquor.  Work with your health care provider to maintain a healthy body weight or to lose weight. Ask what an ideal weight is for you.  Get at least 30 minutes of exercise that causes your heart to beat faster (aerobic exercise) most days of the week. Activities may include walking, swimming, or biking.  Work with your health care provider or diet and nutrition specialist (dietitian) to adjust your eating plan to your individual calorie needs. Reading food labels  Check food labels for the amount of sodium per serving. Choose foods with less than 5 percent of the Daily Value of sodium. Generally, foods with less than 300 mg of sodium per serving fit into this eating plan.  To find whole grains, look for the word "whole" as the first word in the ingredient list. Shopping  Buy products labeled as "low-sodium" or "no salt added."  Buy fresh foods. Avoid canned foods and premade or frozen meals. Cooking  Avoid adding salt when cooking. Use salt-free seasonings or herbs instead of table salt or sea salt. Check with your health  care provider or pharmacist before using salt substitutes.  Do not fry foods. Cook foods using healthy methods such as baking, boiling, grilling, and broiling instead.  Cook with heart-healthy oils, such as olive, canola, soybean, or sunflower oil. Meal planning   Eat a balanced diet that includes: ? 5 or more servings of fruits and vegetables each day. At each meal, try to fill half of your plate with fruits and vegetables. ? Up to 6-8 servings of whole grains each day. ? Less than 6 oz of lean meat, poultry, or fish each day. A 3-oz serving of meat is about the same size as a deck of cards. One egg equals 1 oz. ? 2 servings of low-fat dairy each day. ? A serving of nuts, seeds, or beans 5 times each week. ? Heart-healthy fats. Healthy fats called Omega-3 fatty acids are found in foods such as flaxseeds and coldwater fish, like sardines, salmon, and mackerel.  Limit how much you eat of the following: ? Canned or prepackaged foods. ? Food that is high in trans fat, such as fried foods. ? Food that is high in saturated fat, such as fatty meat. ? Sweets, desserts, sugary drinks, and other foods with added sugar. ? Full-fat dairy products.  Do not salt foods before eating.  Try to eat at least 2 vegetarian meals each week.  Eat more home-cooked food and less restaurant, buffet, and fast food.  When eating at a restaurant, ask  that your food be prepared with less salt or no salt, if possible. What foods are recommended? The items listed may not be a complete list. Talk with your dietitian about what dietary choices are best for you. Grains Whole-grain or whole-wheat bread. Whole-grain or whole-wheat pasta. Brown rice. Orpah Cobb. Bulgur. Whole-grain and low-sodium cereals. Pita bread. Low-fat, low-sodium crackers. Whole-wheat flour tortillas. Vegetables Fresh or frozen vegetables (raw, steamed, roasted, or grilled). Low-sodium or reduced-sodium tomato and vegetable juice.  Low-sodium or reduced-sodium tomato sauce and tomato paste. Low-sodium or reduced-sodium canned vegetables. Fruits All fresh, dried, or frozen fruit. Canned fruit in natural juice (without added sugar). Meat and other protein foods Skinless chicken or [place]. Ground chicken or [place]. Pork with fat trimmed off. Fish and seafood. Egg whites. Dried beans, peas, or lentils. Unsalted nuts, nut butters, and seeds. Unsalted canned beans. Lean cuts of beef with fat trimmed off. Low-sodium, lean deli meat. Dairy Low-fat (1%) or fat-free (skim) milk. Fat-free, low-fat, or reduced-fat cheeses. Nonfat, low-sodium ricotta or cottage cheese. Low-fat or nonfat yogurt. Low-fat, low-sodium cheese. Fats and oils Soft margarine without trans fats. Vegetable oil. Low-fat, reduced-fat, or light mayonnaise and salad dressings (reduced-sodium). Canola, safflower, olive, soybean, and sunflower oils. Avocado. Seasoning and other foods Herbs. Spices. Seasoning mixes without salt. Unsalted popcorn and pretzels. Fat-free sweets. What foods are not recommended? The items listed may not be a complete list. Talk with your dietitian about what dietary choices are best for you. Grains Baked goods made with fat, such as croissants, muffins, or some breads. Dry pasta or rice meal packs. Vegetables Creamed or fried vegetables. Vegetables in a cheese sauce. Regular canned vegetables (not low-sodium or reduced-sodium). Regular canned tomato sauce and paste (not low-sodium or reduced-sodium). Regular tomato and vegetable juice (not low-sodium or reduced-sodium). Rosita Fire. Olives. Fruits Canned fruit in a light or heavy syrup. Fried fruit. Fruit in cream or butter sauce. Meat and other protein foods Fatty cuts of meat. Ribs. Fried meat. Tomasa Blase. Sausage. Bologna and other processed lunch meats. Salami. Fatback. Hotdogs. Bratwurst. Salted nuts and seeds. Canned beans with added salt. Canned or smoked fish. Whole eggs or egg yolks. Chicken  or [place] with skin. Dairy Whole or 2% milk, cream, and half-and-half. Whole or full-fat cream cheese. Whole-fat or sweetened yogurt. Full-fat cheese. Nondairy creamers. Whipped toppings. Processed cheese and cheese spreads. Fats and oils Butter. Stick margarine. Lard. Shortening. Ghee. Bacon fat. Tropical oils, such as coconut, palm kernel, or palm oil. Seasoning and other foods Salted popcorn and pretzels. Onion salt, garlic salt, seasoned salt, table salt, and sea salt. Worcestershire sauce. Tartar sauce. Barbecue sauce. Teriyaki sauce. Soy sauce, including reduced-sodium. Steak sauce. Canned and packaged gravies. Fish sauce. Oyster sauce. Cocktail sauce. Horseradish that you find on the shelf. Ketchup. Mustard. Meat flavorings and tenderizers. Bouillon cubes. Hot sauce and Tabasco sauce. Premade or packaged marinades. Premade or packaged taco seasonings. Relishes. Regular salad dressings. Where to find more information:  National Heart, Lung, and Blood Institute: PopSteam.is  American Heart Association: www.heart.org Summary  The DASH eating plan is a healthy eating plan that has been shown to reduce high blood pressure (hypertension). It may also reduce your risk for type 2 diabetes, heart disease, and stroke.  With the DASH eating plan, you should limit salt (sodium) intake to 2,300 mg a day. If you have hypertension, you may need to reduce your sodium intake to 1,500 mg a day.  When on the DASH eating plan, aim to eat more fresh fruits  and vegetables, whole grains, lean proteins, low-fat dairy, and heart-healthy fats.  Work with your health care provider or diet and nutrition specialist (dietitian) to adjust your eating plan to your individual calorie needs. This information is not intended to replace advice given to you by your health care provider. Make sure you discuss any questions you have with your health care provider. Document Released: 06/22/2011 Document Revised:  06/26/2016 Document Reviewed: 06/26/2016 Elsevier Interactive Patient Education  [employer]2018 Elsevier Inc.

## 2018-01-21 NOTE — MAU Note (Signed)
Not in lobby, went to br to change baby.

## 2018-01-21 NOTE — MAU Note (Signed)
Home nurse was out to see her today, BP was up.  She called the dr and she was told to come in. C/s on 6/24. Denies HA, visual  Changes, or increase in swelling. Minimal incision pain, mainly when moving too much.

## 2018-01-21 NOTE — MAU Provider Note (Signed)
Patient Felicia HarrisJennifer Brennan is a 37 y.o. G2P1011 At 2 weeks postpartum here after having elevated blood pressure at home with her home visit check. She denies headache, blurry vision, NV, dysuria or other ob-gyn complaint.  History     CSN: [id number]  Arrival date and time: 01/21/18 1716   First Provider Initiated Contact with Patient 01/21/18 2214      No chief complaint on file.  HPI She was induced for pre-eclampsia with severe features and subsequently had a c-section for failure to progress. She received MgSo4 while in hospital. She was discharged home in stable condition on Norvasc 10 mg. She says she had not been to the doctor before she got pregnant and so she does not know if she had any blood pressure issues in the past.  Patient last took her blood pressure medicine at 11 am.   OB History    Gravida  2   Para  1   Term  1   Preterm      AB  1   Living  1     SAB  1   TAB      Ectopic      Multiple  0   Live Births  1           Past Medical History:  Diagnosis Date  . Hypertension   . Obese   . Trichimoniasis 01/03/2018   tx 01/05/18  . Vaginal Pap smear, abnormal     Past Surgical History:  Procedure Laterality Date  . CESAREAN SECTION N/A 01/07/2018   Procedure: CESAREAN SECTION;  Surgeon:  BingPickens, Charlie, MD;  [facility];  Service: Obstetrics;  Laterality: N/A;  . NO PAST SURGERIES      Family History  Problem Relation Age of Onset  . Hypertension Mother   . Pancreatic cancer Mother   . COPD Father     Social History   Tobacco Use  . Smoking status: Current Some Day Smoker    Packs/day: 0.25    Types: Cigarettes  . Smokeless tobacco: Never Used  Substance Use Topics  . Alcohol use: Yes    Alcohol/week: 0.6 oz    Types: 1 Glasses of wine per week    Frequency: Never  . Drug use: Yes    Frequency: 2.0 times per week    Types: Marijuana    Allergies:  Allergies  Allergen Reactions  . Pistachio Nut  (Diagnostic) Itching and Swelling    Throat itching and a little swelling    Medications Prior to Admission  Medication Sig Dispense Refill Last Dose  . amLODipine (NORVASC) 10 MG tablet Take 1 tablet (10 mg total) by mouth daily. 30 tablet 0 01/21/2018 at Unknown time  . ibuprofen (ADVIL,MOTRIN) 600 MG tablet Take 1 tablet (600 mg total) by mouth every 6 (six) hours as needed for headache, mild pain or cramping. 30 tablet 3 01/21/2018 at Unknown time  . Prenatal Multivit-Min-Fe-FA (PRENATAL VITAMINS) 0.8 MG tablet Take 1 tablet by mouth daily. 30 tablet 12 Past Week at Unknown time  . metroNIDAZOLE (FLAGYL) 500 MG tablet Take two tablets by mouth twice a day, for one day.  Or you can take all four tablets at once if you can tolerate it. 4 tablet 0 More than a month at Unknown time  . oxyCODONE-acetaminophen (PERCOCET/ROXICET) 5-325 MG tablet Take 1-2 tablets by mouth every 6 (six) hours as needed. 20 tablet 0 More than a month at Unknown time    Review  of Systems  Constitutional: Negative.   HENT: Negative.   Respiratory: Negative.   Cardiovascular: Negative.   Gastrointestinal: Negative.   Genitourinary: Negative.   Musculoskeletal: Negative.   Neurological: Negative.   Hematological: Negative.    Physical Exam   Blood pressure (!) 152/90, pulse 83, temperature 98.6 F (37 C), temperature source Oral, resp. rate 17, weight 291 lb 4 oz (132.1 kg), SpO2 100 %, currently breastfeeding.  Physical Exam  Constitutional: She is oriented to person, place, and time. She appears well-developed.  HENT:  Head: Normocephalic.  Eyes: Pupils are equal, round, and reactive to light.  Neck: Normal range of motion.  GI: Soft.  Musculoskeletal: Normal range of motion.  Neurological: She is alert and oriented to person, place, and time.  Skin: Skin is warm.  Psychiatric: She has a normal mood and affect.    MAU Course  Procedures  MDM CBC, CMP and PRC are all normal.  Patient's BP is  consistent with earlier BPs: 160/95 in Dec, 2018.  140/76 in May 143/89 in June 164/94 in June (intrapartum).   Patient Vitals for the past 24 hrs:  BP Temp Temp src Pulse Resp SpO2 Weight  01/21/18 2209 (!) 152/95 - - 82 18 - -  01/21/18 2145 (!) 152/90 - - 83 17 - -  01/21/18 2131 (!) 154/92 - - 81 18 - -  01/21/18 2123 (!) 153/94 - - 84 19 - -  01/21/18 2115 (!) 149/89 - - 80 18 - -  01/21/18 1956 (!) 152/92 - - 80 - - -  01/21/18 1850 (!) 158/95 98.6 F (37 C) Oral 89 18 100 % 291 lb 4 oz (132.1 kg)     Assessment and Plan   1. Chronic hypertension    2. Patient stable for discharge. Reviewed lab results and patient's PE with Dr. Vergie Living, who recommends changing from Norvasc to Procardia 30 mg XL.   3. Return precautions reviewed with patient (HA, blurry vision, sudden NV, epigastric pain).   4. Patient will start procardia in the AM; return to clinic on Friday AM for BP check.   5. All questions answered; patient verbalized understanding.   Charlesetta Garibaldi Kooistra 01/21/2018, 10:14 PM

## 2018-01-23 ENCOUNTER — Telehealth: Payer: Self-pay | Admitting: Obstetrics and Gynecology

## 2018-01-23 NOTE — Telephone Encounter (Signed)
Called patient about her missed appointment. Left a message for her to come into the office today.

## 2018-01-25 ENCOUNTER — Ambulatory Visit (INDEPENDENT_AMBULATORY_CARE_PROVIDER_SITE_OTHER): Payer: Medicaid Other | Admitting: *Deleted

## 2018-01-25 VITALS — BP 136/90 | HR 87 | Ht 68.0 in | Wt 287.8 lb

## 2018-01-25 DIAGNOSIS — O114 Pre-existing hypertension with pre-eclampsia, complicating childbirth: Secondary | ICD-10-CM

## 2018-01-25 DIAGNOSIS — O1092 Unspecified pre-existing hypertension complicating childbirth: Secondary | ICD-10-CM

## 2018-01-25 NOTE — Progress Notes (Signed)
Pt seen @ MAU 4 days ago due to having elevated BP during PP home visit. Pt states she has been taking Nifedipine as prescribed on 7/8. She denies H/A or visual disturbances. Results reviewed with Dr. Shawnie PonsPratt. PP visit scheduled on 7/31.  Pt has scheduled initial PCP appt w/Scott Bland on 8/12.

## 2018-01-25 NOTE — Progress Notes (Signed)
Patient seen and assessed by nursing staff.  Agree with documentation and plan.  

## 2018-02-13 ENCOUNTER — Ambulatory Visit: Payer: Medicaid Other | Admitting: Obstetrics and Gynecology

## 2018-02-14 ENCOUNTER — Encounter: Payer: Self-pay | Admitting: Obstetrics and Gynecology

## 2018-02-14 ENCOUNTER — Ambulatory Visit: Payer: Medicaid Other | Admitting: Obstetrics and Gynecology

## 2018-02-18 NOTE — Progress Notes (Signed)
Patient did not keep her postpartum appointment for 02/18/2018.  Cornelia Copaharlie Meredith Mells, Jr MD Attending Center [facility](Faculty Practice)

## 2018-02-25 ENCOUNTER — Ambulatory Visit (INDEPENDENT_AMBULATORY_CARE_PROVIDER_SITE_OTHER): Payer: Medicaid Other | Admitting: Family Medicine

## 2018-02-25 ENCOUNTER — Encounter: Payer: Self-pay | Admitting: Family Medicine

## 2018-02-25 ENCOUNTER — Other Ambulatory Visit: Payer: Self-pay

## 2018-02-25 VITALS — BP 148/92 | HR 83 | Temp 98.5°F | Ht 68.0 in | Wt 284.8 lb

## 2018-02-25 DIAGNOSIS — F1290 Cannabis use, unspecified, uncomplicated: Secondary | ICD-10-CM | POA: Diagnosis not present

## 2018-02-25 DIAGNOSIS — I159 Secondary hypertension, unspecified: Secondary | ICD-10-CM | POA: Diagnosis not present

## 2018-02-25 DIAGNOSIS — F17200 Nicotine dependence, unspecified, uncomplicated: Secondary | ICD-10-CM | POA: Diagnosis not present

## 2018-02-25 DIAGNOSIS — Z13220 Encounter for screening for lipoid disorders: Secondary | ICD-10-CM

## 2018-02-25 DIAGNOSIS — Z6841 Body Mass Index (BMI) 40.0 and over, adult: Secondary | ICD-10-CM | POA: Diagnosis not present

## 2018-02-25 NOTE — Patient Instructions (Signed)
It was a pleasure to see you today! Thank you for choosing [facility] for your primary care. Felicia Brennan was seen for establishing care. Come back to the clinic if you have any routine concerns, and go to the emergency room if you have any life threatening symptoms.  Today we talked about ways to help your blood pressure, options for stopping smoking and have drawn some blood work to check your cholesterol.   If we did any lab work today that did not result today, one of two things will happen.  1. If everything is normal, you will get a letter in mail sent to the address in your chart with the results for your records.  It is important to keep your address up to date as that is where we will send results.  2. If the results require some sort of discussion, my nurses or myself will call you on the phone number listed in your records.  It is important to keep your phone number up to date in our system as this is how we will try to reach you.  If we cannot reach you on the phone, we will try to send you a letter in the mail so please enable to voicemail function of your phone.  If you don't hear from us in two weeks, please give us a call to verify your results. Otherwise, we look forward to seeing you again at your next visit. If you have any questions or concerns before then, please call the clinic at [phone].   Please bring all your medications to every doctors visit   Sign up for My Chart to have easy access to your labs results, and communication with your Primary care physician.     Please check-out at the front desk before leaving the clinic.     Best,  Dr. Marthenia RollingScott Skilar Brennan FAMILY MEDICINE RESIDENT - PGY2 02/25/2018 3:57 PM

## 2018-02-25 NOTE — Progress Notes (Signed)
Subjective:  Felicia Brennan is a 37 y.o. female [facility] with a chief complaint of establshing care.   HPI: Has not had regular primary medical care in past so medical history may be incomplete.  [id number] with ~8wk old baby here.  Pregnancy most noteable for gestational HTN and pre-eclampsia.  HTN has continued post partum and she was recently increased on her nifedipine to 30mg .  HTN to 140sbp at her visit today but states she has been stressed out with the baby crying.  She also had abnormal pap (NIL with HPV+) during pregnancy and was advised to get a post partum colpo (she consents to scheduling this).  She has no other physical complaints to discuss, she states, "I have a baby now, it's time to make sure I'm taking care of myself"   Review of Systems  HENT: Negative for ear pain and tinnitus.   Eyes: Negative for pain and discharge.  Respiratory: Negative for cough and wheezing.   Cardiovascular: Negative for chest pain and palpitations.  Gastrointestinal: Negative for blood in stool, constipation and diarrhea.  Genitourinary: Negative for dysuria and urgency.  Neurological: Negative for seizures and loss of consciousness.  Psychiatric/Behavioral: Positive for substance abuse. Negative for suicidal ideas.     Objective:  Physical Exam: BP (!) 148/92   Pulse 83   Temp 98.5 F (36.9 C) (Oral)   Ht 5\' 8"  (1.727 m)   Wt 284 lb 12.8 oz (129.2 kg)   SpO2 98%   BMI 43.30 kg/m   Gen: NAD, conversing comfortably, obese CV: RRR with no murmurs appreciated Pulm: NWOB, CTAB with no crackles, wheezes, or rhonchi GI: Normal bowel sounds present. Soft, Nontender, Nondistended. MSK: no edema, cyanosis, or clubbing noted Skin: warm, dry Neuro: grossly normal, moves all extremities Psych: Normal affect and thought content  Results for orders placed or performed in visit on 02/25/18 (from the past 72 hour(s))  Lipid Panel     Status: Abnormal   Collection  Time: 02/25/18  4:06 PM  Result Value Ref Range   Cholesterol, Total 185 100 - 199 mg/dL   Triglycerides 454127 0 - 149 mg/dL   HDL 51 >09>39 mg/dL   VLDL Cholesterol Cal 25 5 - 40 mg/dL   LDL Calculated 811109 (H) 0 - 99 mg/dL   Chol/HDL Ratio 3.6 0.0 - 4.4 ratio    Comment:                                   T. Chol/HDL Ratio                                             Men  Women                               1/2 Avg.Risk  3.4    3.3                                   Avg.Risk  5.0    4.4  2X Avg.Risk  9.6    7.1                                3X Avg.Risk 23.4   11.0      Assessment/Plan:  Screening cholesterol level No lipid panel on record in obese patient, will check lipids  Secondary hypertension had incomplete care prior to gestational HTN, now on nifedipine 30mg  post partum  Discussed diet/activity interventions.  Will recheck in future months to see if more medication required.   Class 3 severe obesity with serious comorbidity and body mass index (BMI) of 40.0 to 44.9 in adult [facility](HCC) We discussed some diet/activity interventions that could address weight and also help control HTN.  Will consider nutrition f/u at future visits  Marijuana smoker, episodic Episodic, did smoke some during pregnancy.  We discussed her goal to cut back and she thinks she can do it on her own  Tobacco use disorder Still smokes daily.  Is "half way" to deciding to quit.  Does not smoke near baby.  We discussed options to help assist quitting when she reached a decision point and she said she would consider it but it's a "stress reliever" for her now   Marthenia RollingScott Nyari Olsson, DO FAMILY MEDICINE RESIDENT - PGY2 02/26/2018 8:19 AM

## 2018-02-26 DIAGNOSIS — Z13220 Encounter for screening for lipoid disorders: Secondary | ICD-10-CM | POA: Insufficient documentation

## 2018-02-26 DIAGNOSIS — I10 Essential (primary) hypertension: Secondary | ICD-10-CM | POA: Insufficient documentation

## 2018-02-26 DIAGNOSIS — F1290 Cannabis use, unspecified, uncomplicated: Secondary | ICD-10-CM | POA: Insufficient documentation

## 2018-02-26 DIAGNOSIS — Z6841 Body Mass Index (BMI) 40.0 and over, adult: Secondary | ICD-10-CM

## 2018-02-26 DIAGNOSIS — I159 Secondary hypertension, unspecified: Secondary | ICD-10-CM | POA: Insufficient documentation

## 2018-02-26 DIAGNOSIS — F17200 Nicotine dependence, unspecified, uncomplicated: Secondary | ICD-10-CM | POA: Insufficient documentation

## 2018-02-26 LAB — LIPID PANEL
Chol/HDL Ratio: 3.6 ratio (ref 0.0–4.4)
Cholesterol, Total: 185 mg/dL (ref 100–199)
HDL: 51 mg/dL (ref >39)
LDL Calculated: 109 mg/dL — ABNORMAL HIGH (ref 0–99)
Triglycerides: 127 mg/dL (ref 0–149)
VLDL Cholesterol Cal: 25 mg/dL (ref 5–40)

## 2018-02-26 NOTE — Assessment & Plan Note (Signed)
Episodic, did smoke some during pregnancy.  We discussed her goal to cut back and she thinks she can do it on her own

## 2018-02-26 NOTE — Assessment & Plan Note (Signed)
Still smokes daily.  Is "half way" to deciding to quit.  Does not smoke near baby.  We discussed options to help assist quitting when she reached a decision point and she said she would consider it but it's a "stress reliever" for her now

## 2018-02-26 NOTE — Assessment & Plan Note (Signed)
We discussed some diet/activity interventions that could address weight and also help control HTN.  Will consider nutrition f/u at future visits

## 2018-02-26 NOTE — Assessment & Plan Note (Signed)
No lipid panel on record in obese patient, will check lipids

## 2018-02-26 NOTE — Assessment & Plan Note (Signed)
had incomplete care prior to gestational HTN, now on nifedipine 30mg  post partum  Discussed diet/activity interventions.  Will recheck in future months to see if more medication required.

## 2018-03-01 ENCOUNTER — Telehealth: Payer: Self-pay | Admitting: *Deleted

## 2018-03-01 NOTE — Telephone Encounter (Signed)
-----   Message from Marthenia RollingScott Bland, DO sent at 02/26/2018  8:04 AM EDT ----- I'm sorry y'all, I forgot to do this during her visit yesterday.  Can you call her and schedule her for colpo clinic?  She had an abnormal pap that needs f/u now that she is post-partum.  She is aware of that so won't be surprised.  -Dr. bland

## 2018-03-01 NOTE — Telephone Encounter (Signed)
Pt informed of below and appointment scheduled. Zimmerman Rumple, April D, CMA  

## 2018-03-07 ENCOUNTER — Ambulatory Visit: Payer: Medicaid Other | Admitting: Family Medicine

## 2018-03-07 ENCOUNTER — Other Ambulatory Visit: Payer: Self-pay

## 2018-03-07 ENCOUNTER — Other Ambulatory Visit (HOSPITAL_COMMUNITY)
Admission: RE | Admit: 2018-03-07 | Discharge: 2018-03-07 | Disposition: A | Discharge status: Home / Self Care | Payer: Medicaid Other | Source: Ambulatory Visit | Attending: Family Medicine | Admitting: Family Medicine

## 2018-03-07 VITALS — BP 130/90 | HR 102 | Temp 98.7°F | Wt 288.0 lb

## 2018-03-07 DIAGNOSIS — R87618 Other abnormal cytological findings on specimens from cervix uteri: Secondary | ICD-10-CM | POA: Insufficient documentation

## 2018-03-07 LAB — POCT URINE PREGNANCY: PREG TEST UR: NEGATIVE

## 2018-03-08 NOTE — Progress Notes (Signed)
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  Pap obtained during her pregnancy.  She was recommended to come to colposcopy clinic after delivery and is here for follow-up today.  ROS: Negative for unusual vaginal bleeding.  Negative for pelvic pain.  No dyspareunia. OBJ:  General: Well-developed obese female no acute distress GU: Externally normal female anatomy.  No adnexal masses or tenderness are noted however at this exam is limited by habitus.  Cervix appears normal.  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  No history of prior abnormals.  Reviewed options with her including colposcopy today which I do not think is necessary.  Ultimately we decided to perform HPV 16, 18, 45 testing.  If negative, would repeat cotesting 1 year.  If positive, then I would perform colposcopy.  Discussed this at length spending greater than 50% of our 15-minute office visit in counseling education regarding these issues and coordination of care.  I will notify her of pathology results.

## 2018-03-13 LAB — CYTOLOGY - PAP
DIAGNOSIS: NEGATIVE
HPV 16/18/45 GENOTYPING: POSITIVE — AB
HPV: DETECTED — AB

## 2018-03-15 ENCOUNTER — Telehealth: Payer: Self-pay | Admitting: Family Medicine

## 2018-03-15 NOTE — Telephone Encounter (Signed)
Pt called and appt made for 04/18/18.  She was unable to come in sooner due to her work schedule. Totiana Everson, Maryjo RochesterJessica Dawn, CMA

## 2018-03-15 NOTE — Telephone Encounter (Signed)
Attempted to call patient regarding Pap results.  Felicia Starrarina Ogden Handlin, MD  Family Medicine Teaching Service

## 2018-03-15 NOTE — Telephone Encounter (Signed)
Called patient regarding Pap. HPV positive for 18. NILM cytology. Needs colposcopy. All questions answered.   Nursing- can you please call her to schedule this?  Terisa Starrarina Shelia Kingsberry, MD  Family Medicine

## 2018-04-18 ENCOUNTER — Ambulatory Visit: Payer: Medicaid Other

## 2019-05-12 IMAGING — US US MFM OB FOLLOW-UP
1 series · 13 of 28 positions shown · non-contrast
Comparison: none

[Series 1: us mfm ob follow-up · 13 of 32 slices shown]
[im 2/32]
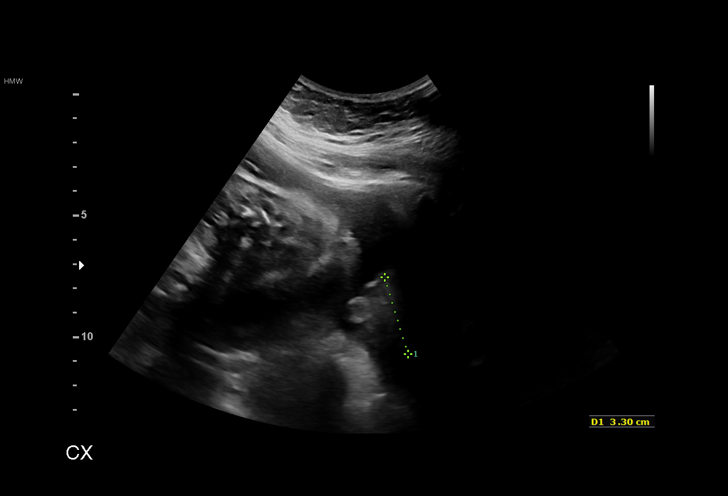
[im 4/32]
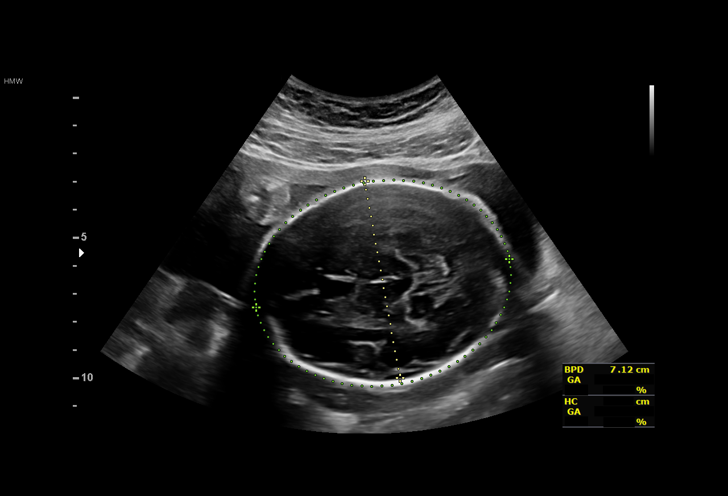
[im 6/32]
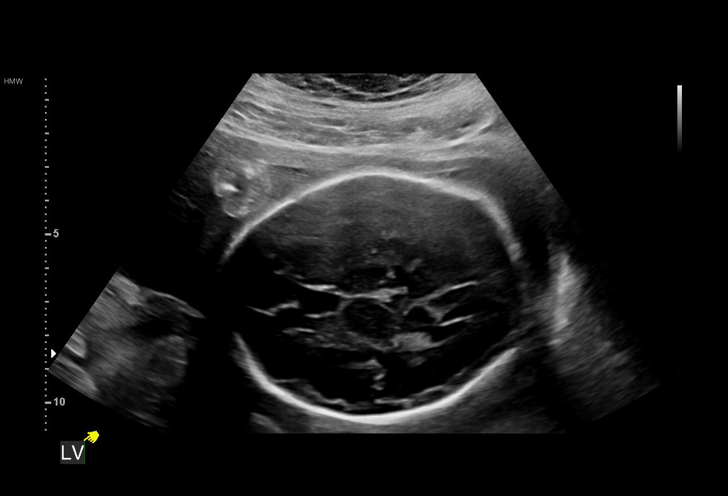
[im 9/32]
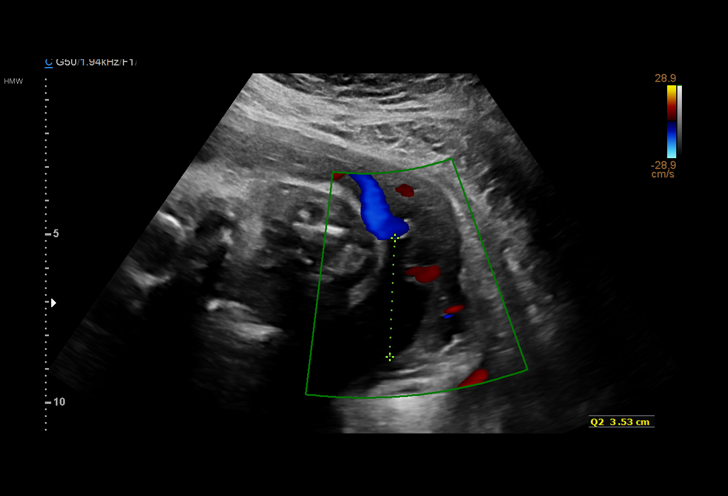
[im 11/32]
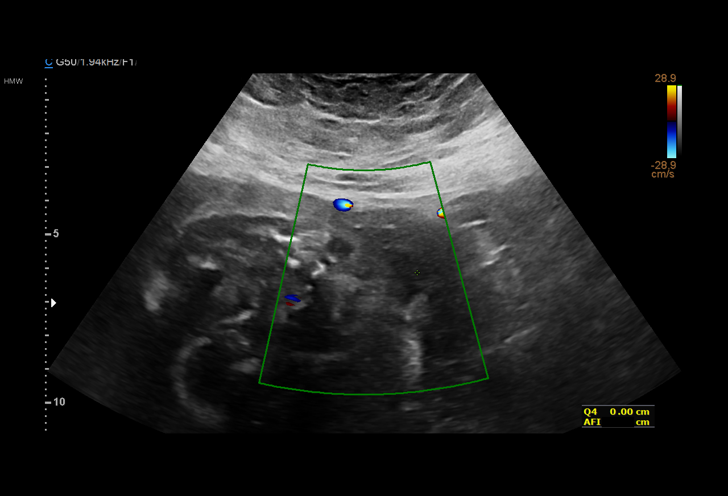
[im 13/32]
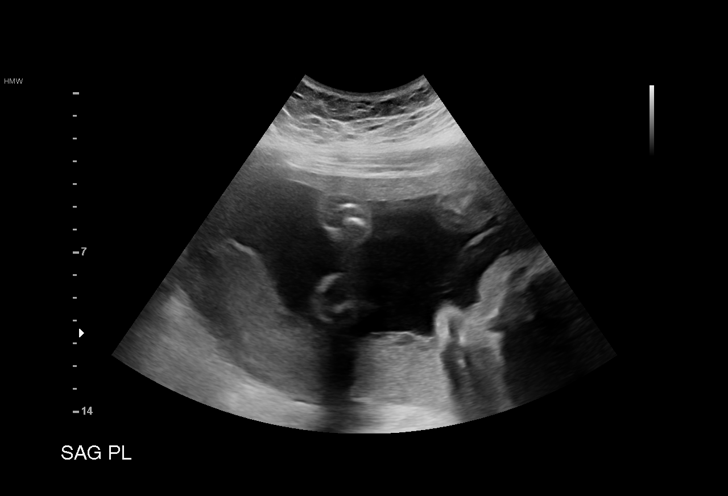
[im 17/32]
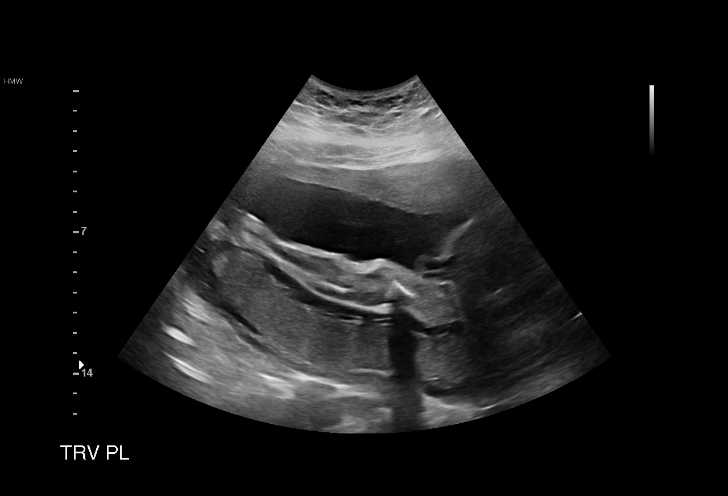
[im 19/32]
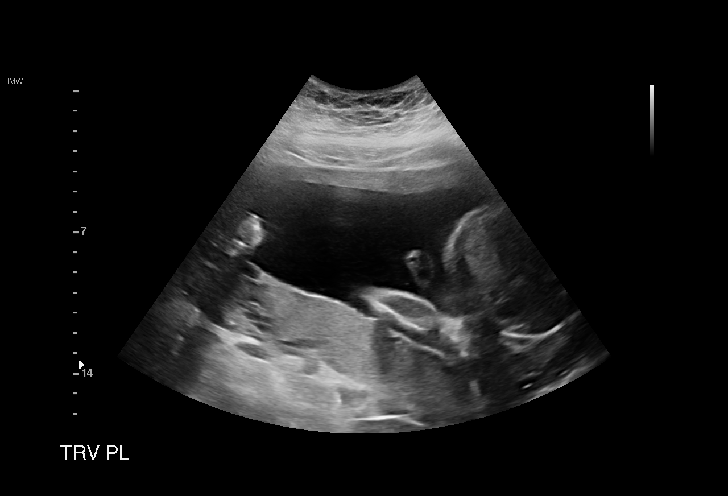
[im 21/32]
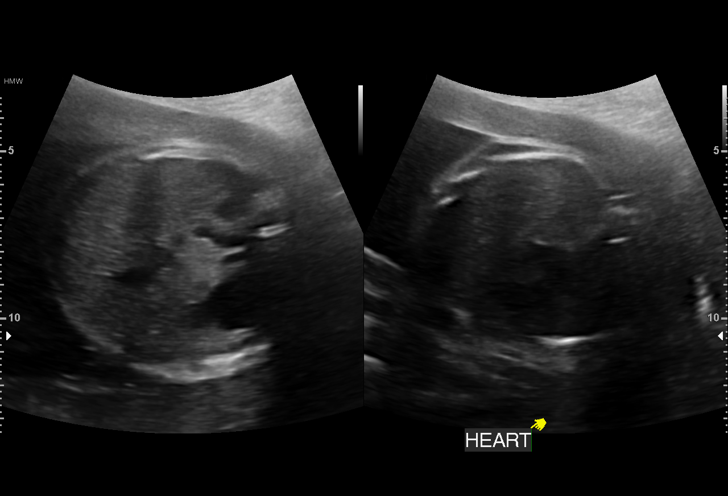
[im 23/32]
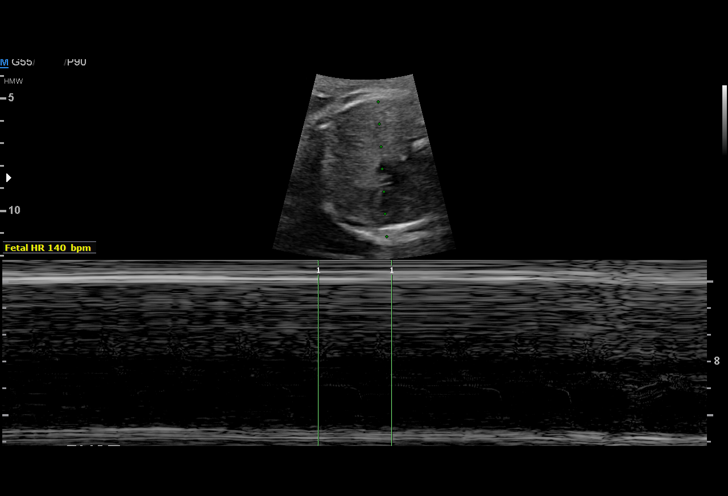
[im 26/32]
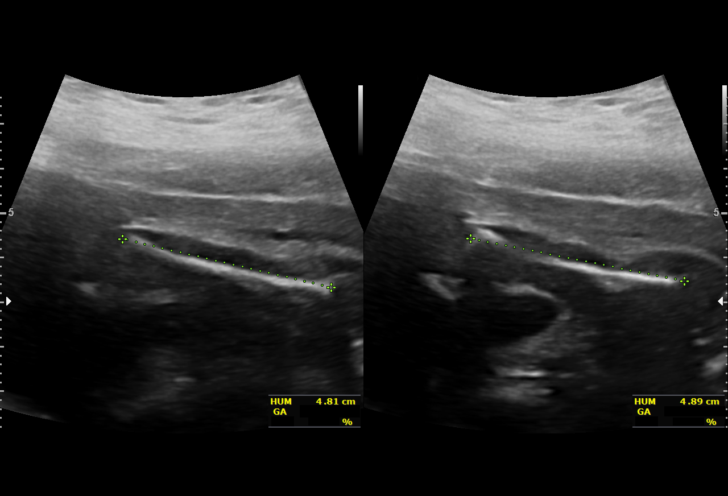
[im 28/32]
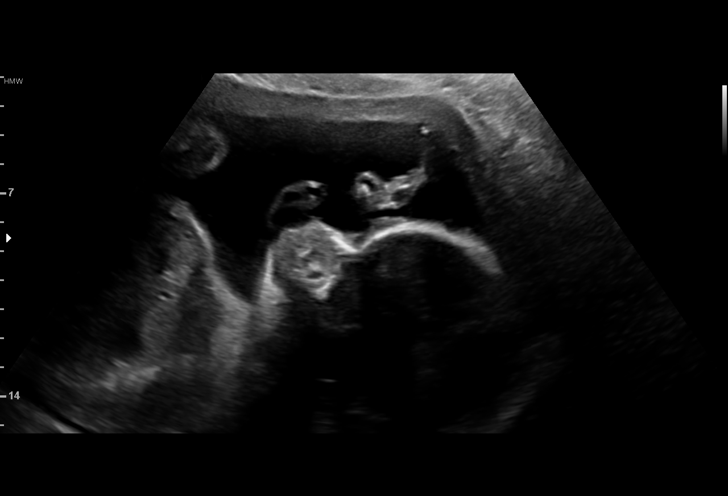
[im 30/32]
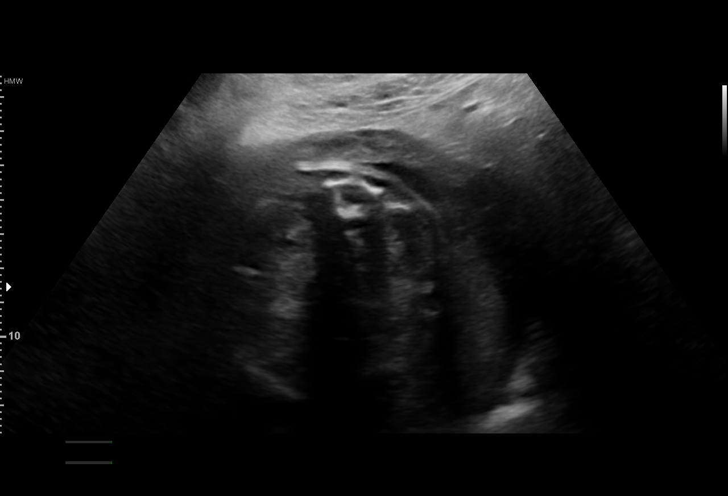

[13 of 28 positions shown; findings below may reference images not displayed]

1  IASC MALO            [id number]      [id number]     [id number]
Indications

28 weeks gestation of pregnancy
Hypertension - Chronic/Pre-existing; ASA
Advanced maternal age primigravida 35+,
second trimester; declined testing
Obesity complicating pregnancy, second
trimester
OB History

Blood Type:            Height:  5'8"   Weight (lb):  280       BMI:
Gravidity:    2         Term:   0         SAB:   1
Fetal Evaluation

Num Of Fetuses:     1
Fetal Heart         140
Rate(bpm):
Cardiac Activity:   Observed
Presentation:       Cephalic
Placenta:           Posterior, above cervical os
P. Cord Insertion:  Previously Visualized

Amniotic Fluid
AFI FV:      Subjectively within normal limits

AFI Sum(cm)     %Tile       Largest Pocket(cm)
12.56           33

RUQ(cm)       RLQ(cm)       LUQ(cm)        LLQ(cm)
4.57          0
Biometry

BPD:      71.1  mm     G. Age:  28w 4d         42  %    CI:        74.43   %    70 - 86
FL/HC:      20.9   %    18.8 -
HC:      261.6  mm     G. Age:  28w 3d         21  %    HC/AC:      1.11        1.05 -
AC:      235.7  mm     G. Age:  27w 6d         27  %    FL/BPD:     77.1   %    71 - 87
FL:       54.8  mm     G. Age:  28w 6d         50  %    FL/AC:      23.2   %    20 - 24
HUM:      48.5  mm     G. Age:  28w 4d         46  %

Est. FW:    9595  gm    2 lb 11 oz      49  %
Gestational Age

LMP:           29w 6d        Date:  03/28/17                 EDD:   01/02/18
U/S Today:     28w 3d                                        EDD:   01/12/18
Best:          28w 3d     Det. By:  U/S  (08/14/17)          EDD:   01/12/18
Anatomy

Cranium:               Appears normal         Aortic Arch:            Previously seen
Cavum:                 Previously seen        Ductal Arch:            Previously seen
Ventricles:            Appears normal         Diaphragm:              Previously seen
Choroid Plexus:        Previously seen        Stomach:                Appears normal, left
sided
Cerebellum:            Previously seen        Abdomen:                Previously seen
Posterior Fossa:       Previously seen        Abdominal Wall:         Previously seen
Nuchal Fold:           Not applicable (>20    Cord Vessels:           Previously seen
wks GA)
Face:                  Orbits and profile     Kidneys:                Appear normal
previously seen
Lips:                  Previously seen        Bladder:                Appears normal
Thoracic:              Appears normal         Spine:                  Previously seen
Heart:                 Previously seen        Upper Extremities:      Previously seen
RVOT:                  Appears normal         Lower Extremities:      Previously seen
LVOT:                  Previously seen

Other:  Female gender. Rt 5th digit prev. visualized. Heels prev. visualized.
Nasal bone prev.visualized. Open hands prev. visualized. Technically
difficult due to maternal habitus and fetal position.
Cervix Uterus Adnexa

Cervix
Normal appearance by transabdominal scan.

Uterus
No abnormality visualized.

Left Ovary
No adnexal mass visualized.

Right Ovary
No adnexal mass visualized.

Cul De Sac:   No free fluid seen.

Adnexa:       No abnormality visualized.
Impression

Singleton intrauterine pregnancy at 28+3 weeks with AMA
and CHTN here for growth evaluation
Interval review of the anatomy shows no sonographic
markers for aneuploidy or structural anomalies
All relevant fetal anatomy has been visualized
Amniotic fluid volume is normal
Estimated fetal weight shows growth in the 49th percentile
Recommendations

Recommend follow-up ultrasound examination in 4 weeks for
growth evaluation due to CHTN

## 2019-06-09 IMAGING — US US MFM OB FOLLOW-UP
1 series · 12 of 28 positions shown · non-contrast
Comparison: none

[Series 1: us mfm ob follow-up · 12 of 34 slices shown]
[im 2/34]
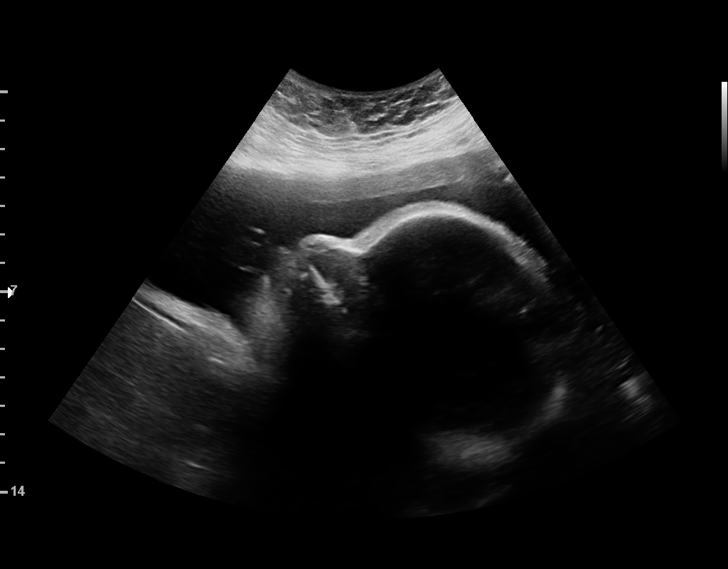
[im 4/34]
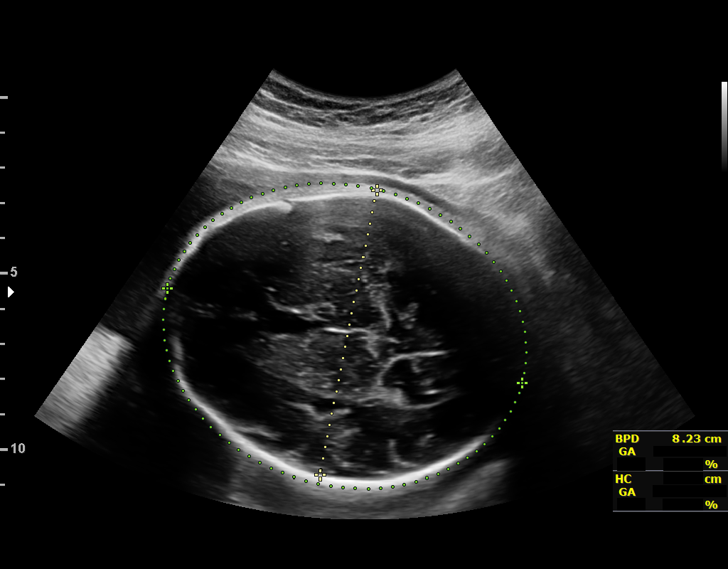
[im 7/34]
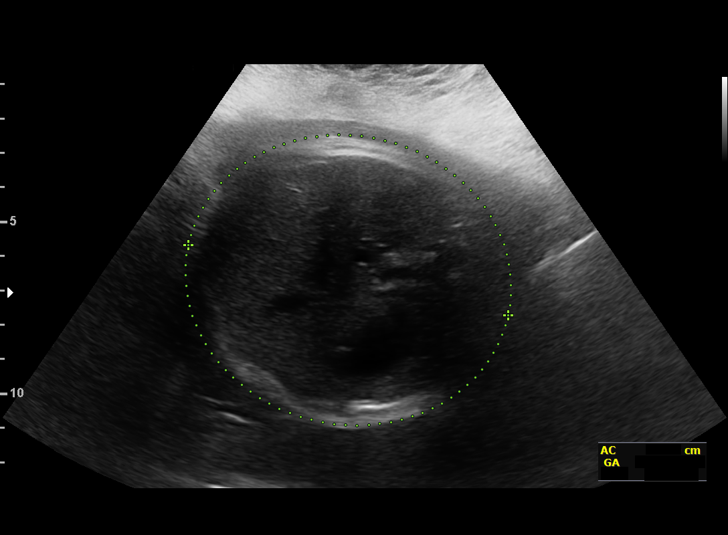
[im 10/34]
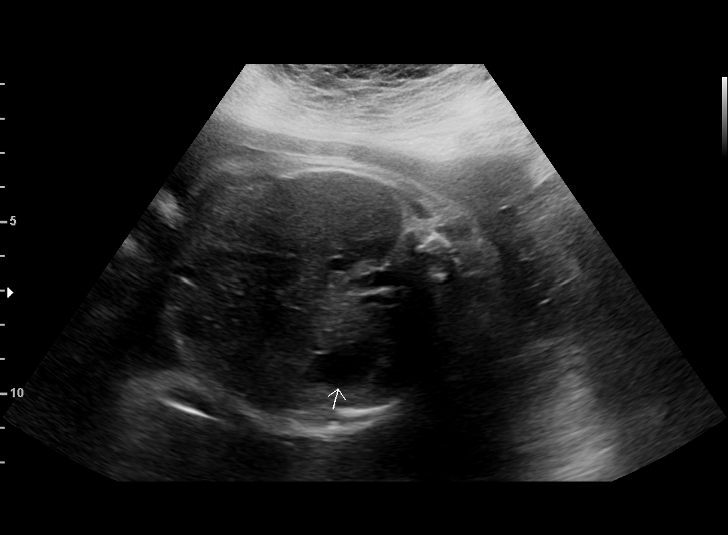
[im 13/34]
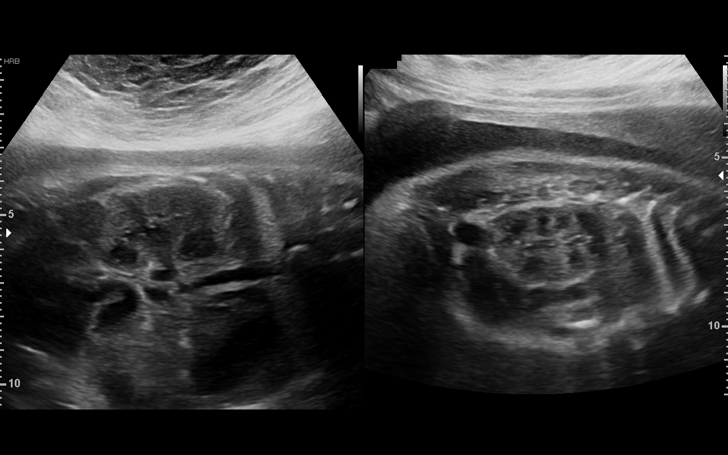
[im 15/34]
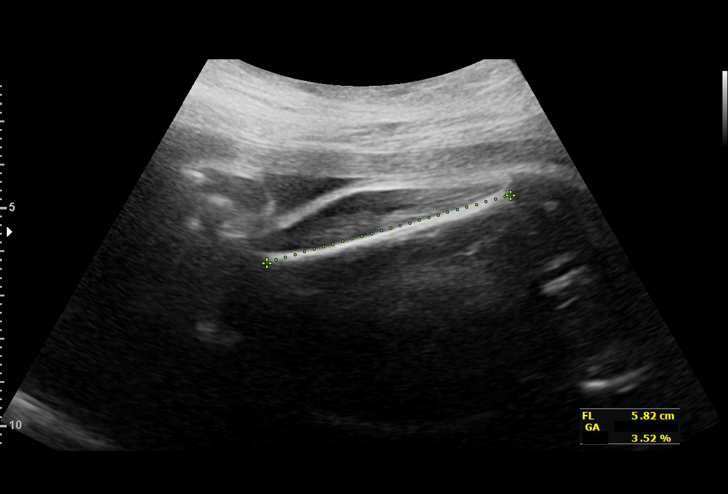
[im 19/34]
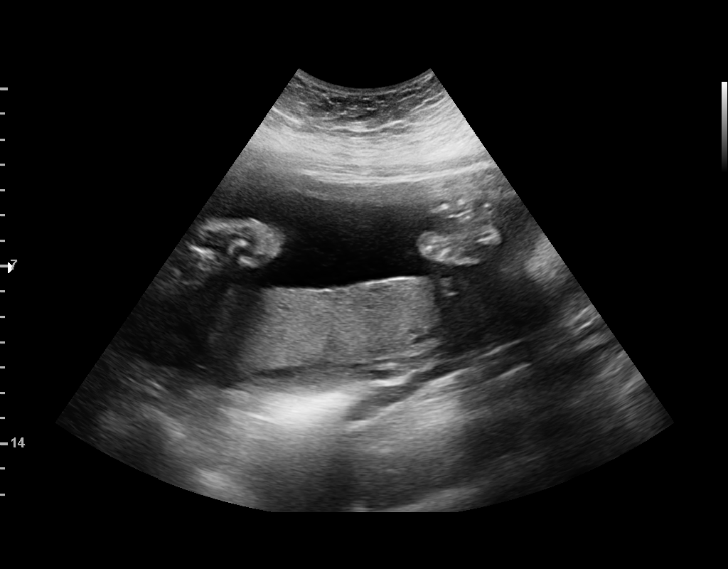
[im 21/34]
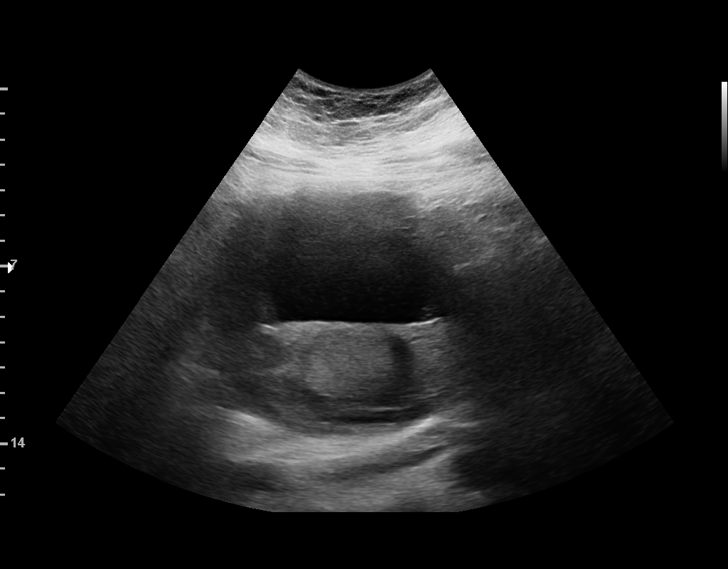
[im 24/34]
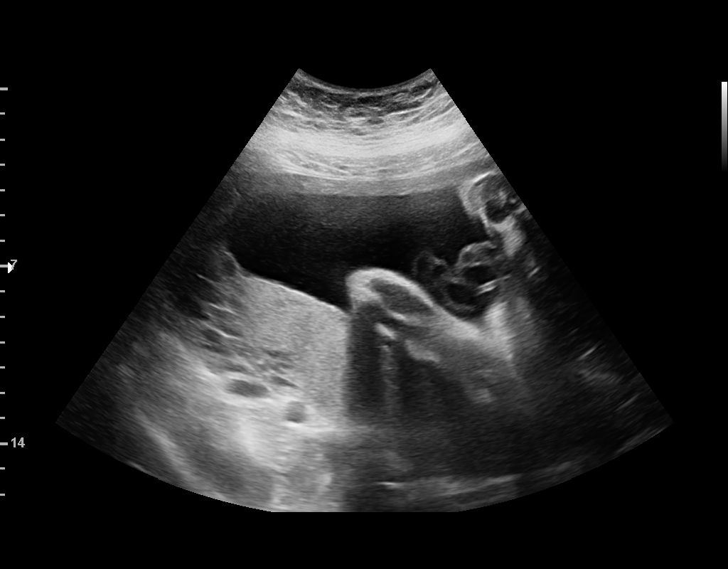
[im 27/34]
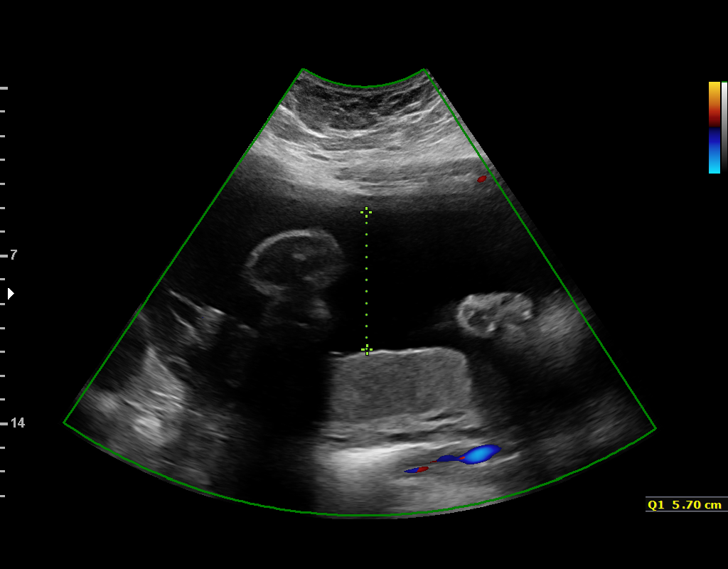
[im 30/34]
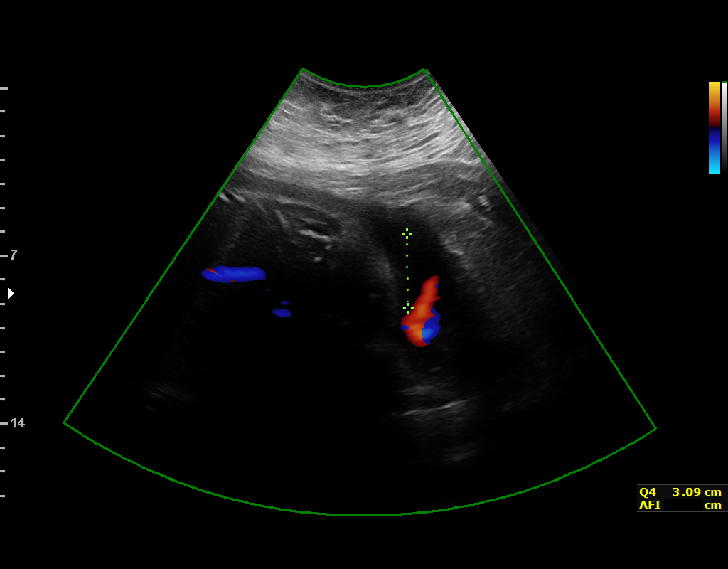
[im 32/34]
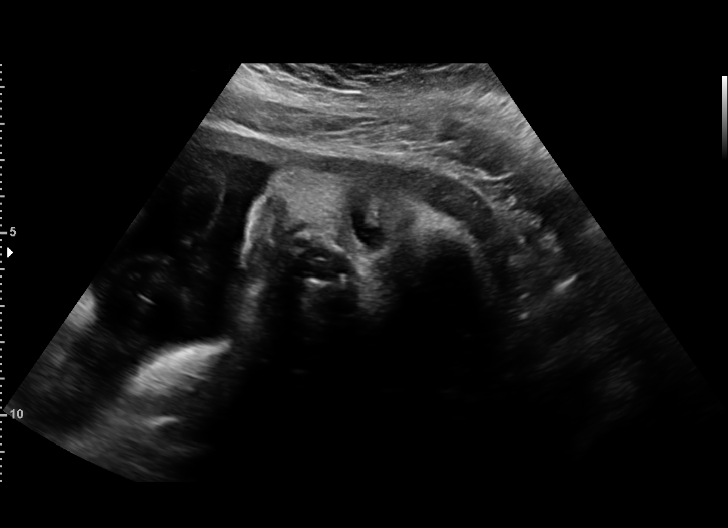

[12 of 28 positions shown; findings below may reference images not displayed]

1  NAZARETH JUMPER              [id number]      [id number]     [id number]
2  NAZARETH JUMPER              [id number]      [id number]     [id number]
Indications

32 weeks gestation of pregnancy
Hypertension - Chronic/Pre-existing; ASA
Advanced maternal age primigravida 35+,
second trimester; declined testing
Obesity complicating pregnancy, second
trimester
OB History

Blood Type:            Height:  5'8"   Weight (lb):  280       BMI:
Gravidity:    2         Term:   0         SAB:   1
Fetal Evaluation

Num Of Fetuses:     1
Fetal Heart         143
Rate(bpm):
Cardiac Activity:   Observed
Presentation:       Cephalic
Placenta:           Posterior, above cervical os
P. Cord Insertion:  Previously Visualized

Amniotic Fluid
AFI FV:      Subjectively within normal limits

AFI Sum(cm)     %Tile       Largest Pocket(cm)
17.27           63

RUQ(cm)       RLQ(cm)       LUQ(cm)        LLQ(cm)
5.7
Biophysical Evaluation
Amniotic F.V:   Within normal limits       F. Tone:        Observed
F. Movement:    Observed                   Score:          [DATE]
F. Breathing:   Observed
Biometry

BPD:      82.1  mm     G. Age:  33w 0d         60  %    CI:        77.79   %    70 - 86
FL/HC:      20.1   %    19.1 -
HC:      294.6  mm     G. Age:  32w 4d         18  %    HC/AC:      1.05        0.96 -
AC:      281.2  mm     G. Age:  32w 1d         42  %    FL/BPD:     72.0   %    71 - 87
FL:       59.1  mm     G. Age:  30w 6d          7  %    FL/AC:      21.0   %    20 - 24
HUM:      54.1  mm     G. Age:  31w 3d         35  %

Est. FW:    9232  gm      4 lb 1 oz     46  %
Gestational Age

LMP:           33w 6d        Date:  03/28/17                 EDD:   01/02/18
U/S Today:     32w 1d                                        EDD:   01/14/18
Best:          32w 3d     Det. By:  U/S  (08/14/17)          EDD:   01/12/18
Anatomy

Cranium:               Appears normal         Aortic Arch:            Previously seen
Cavum:                 Appears normal         Ductal Arch:            Previously seen
Ventricles:            Appears normal         Diaphragm:              Previously seen
Choroid Plexus:        Previously seen        Stomach:                Appears normal, left
sided
Cerebellum:            Previously seen        Abdomen:                Appears normal
Posterior Fossa:       Previously seen        Abdominal Wall:         Previously seen
Nuchal Fold:           Not applicable (>20    Cord Vessels:           Previously seen
wks GA)
Face:                  Orbits and profile     Kidneys:                Appear normal
previously seen
Lips:                  Previously seen        Bladder:                Appears normal
Thoracic:              Appears normal         Spine:                  Previously seen
Heart:                 Appears normal         Upper Extremities:      Previously seen
(4CH, axis, and situs
RVOT:                  Previously seen        Lower Extremities:      Previously seen
LVOT:                  Previously seen

Other:  Female gender. Rt 5th digit prev. visualized. Heels prev. visualized.
Nasal bone prev.visualized. Open hands prev. visualized. Technically
difficult due to maternal habitus and fetal position.
Cervix Uterus Adnexa

Cervix
Not visualized (advanced GA >06wks)
Impression

SIUP at 32+3 weeks
Normal interval anatomy; anatomic survey complete
Normal amniotic fluid volume
Appropriate interval growth with EFW at the 46th %tile
BPP [DATE]
Recommendations

Continue antenatal testing
Follow-up ultrasound for growth in 4 weeks

## 2019-07-07 IMAGING — US US MFM FETAL BPP W/O NON-STRESS
1 series · 14 of 28 positions shown · non-contrast
Comparison: none

[Series 1: us mfm fetal bpp w/o non-stress · 61 acquisitions, 14 frames shown]
[im 3/61]
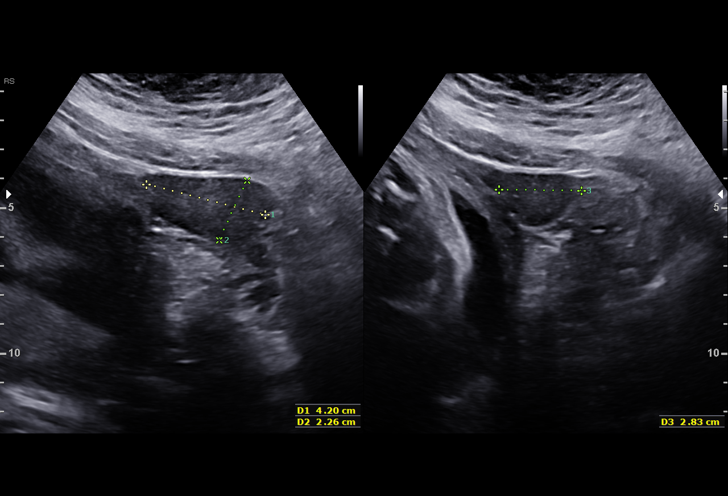
[im 7/61]
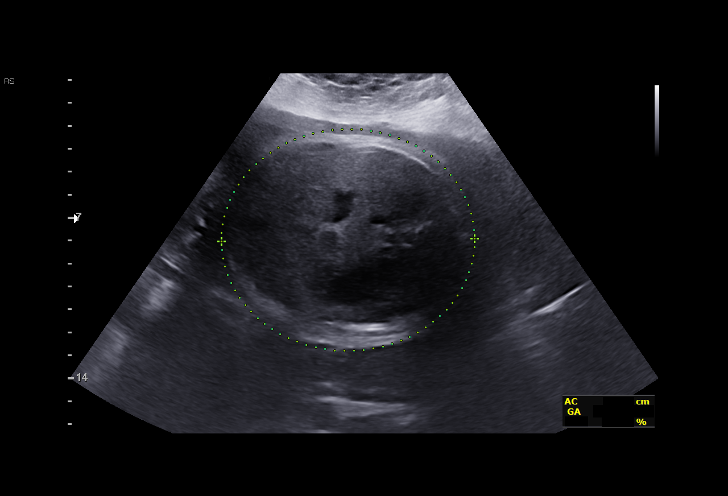
[im 12/61]
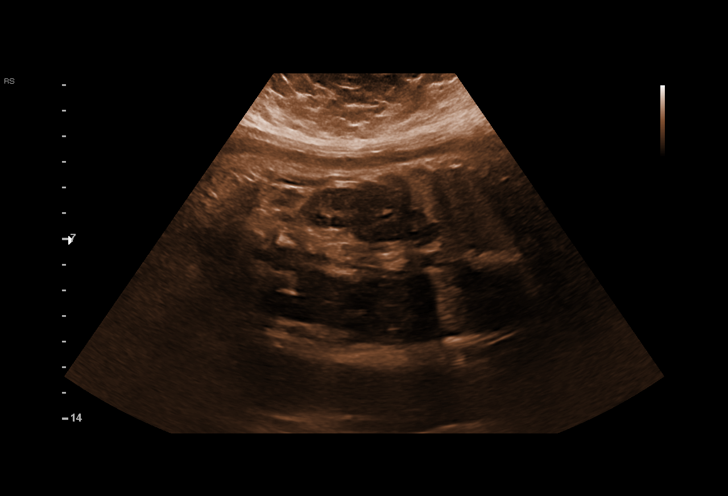
[im 16/61]
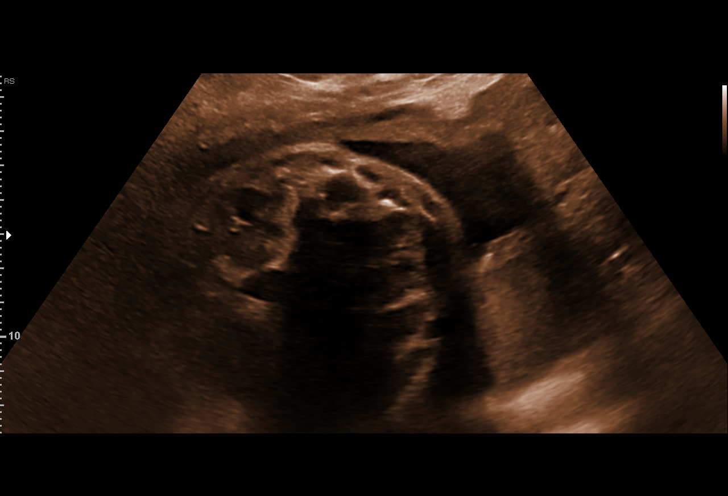
[im 21/61]
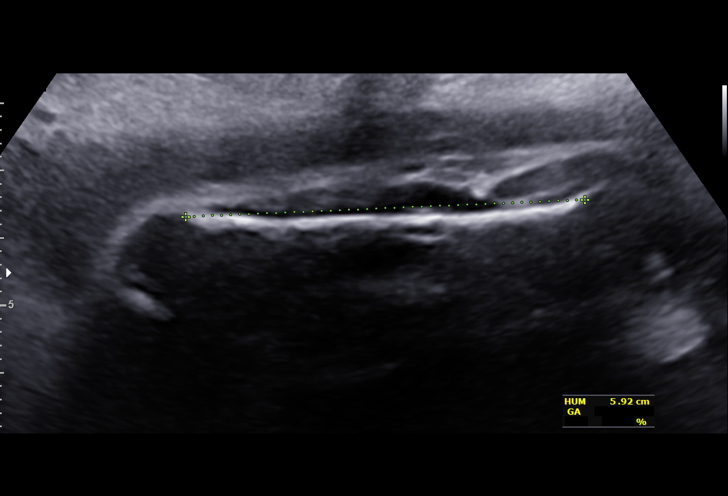
[im 25/61]
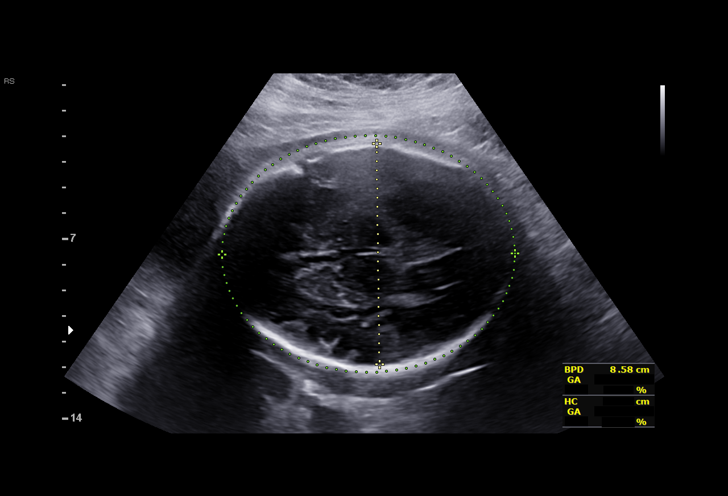
[im 29/61]
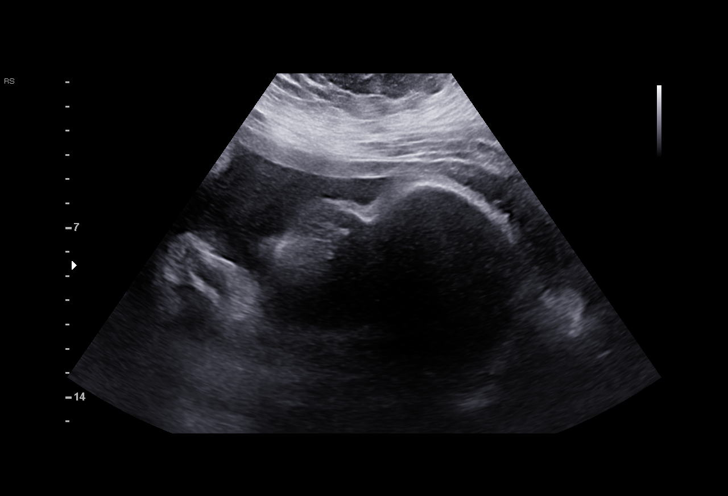
[im 34/61]
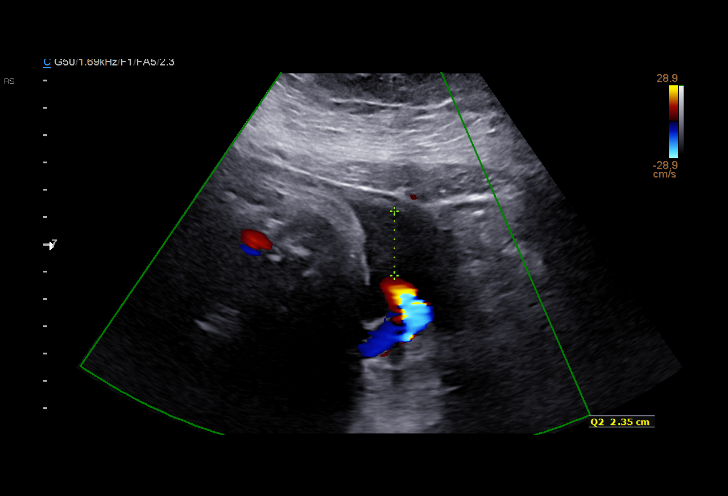
[im 38/61]
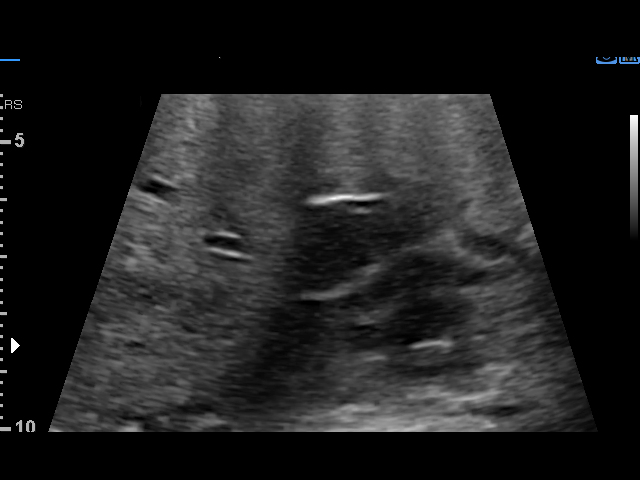
[im 43/61]
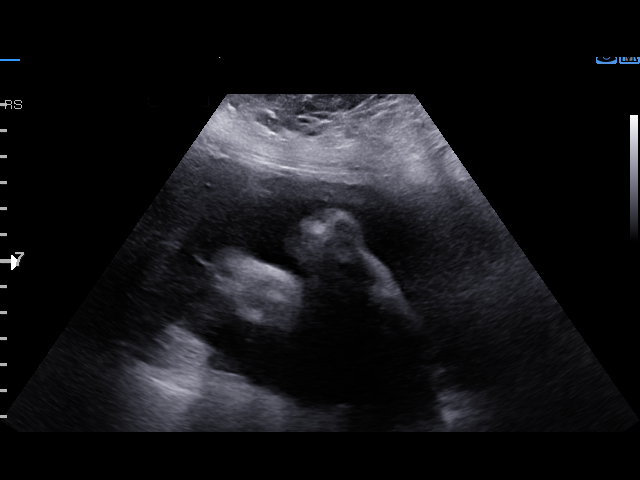
[im 47/61]
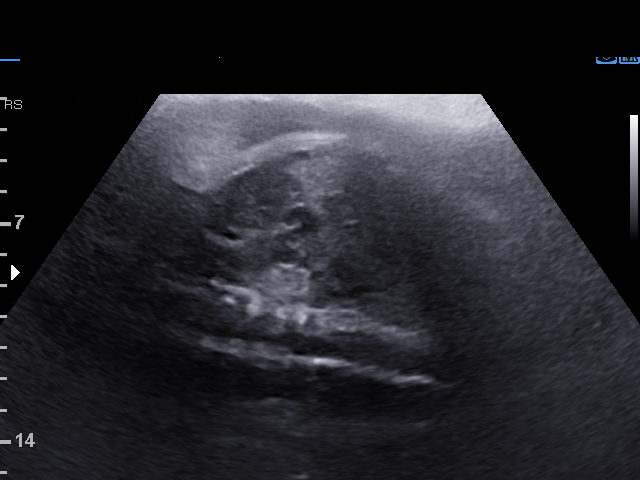
[im 52/61]
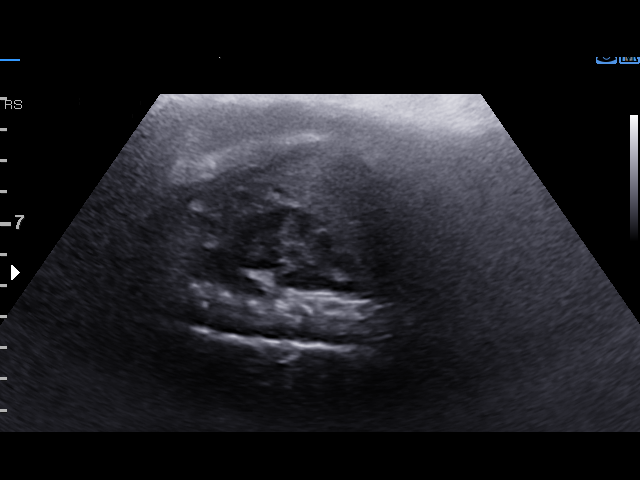
[im 56/61]
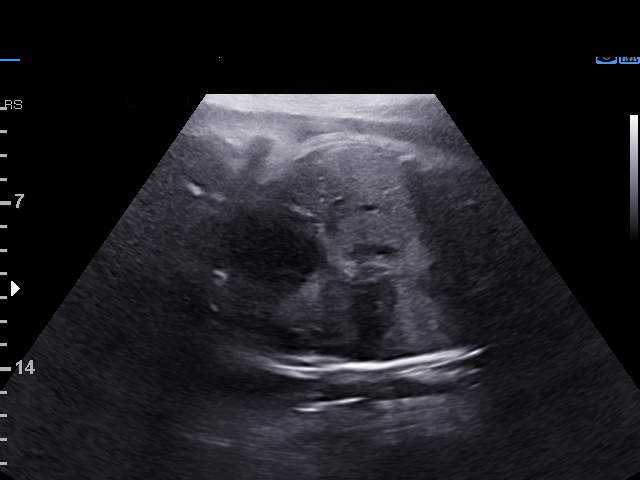
[im 61/61]
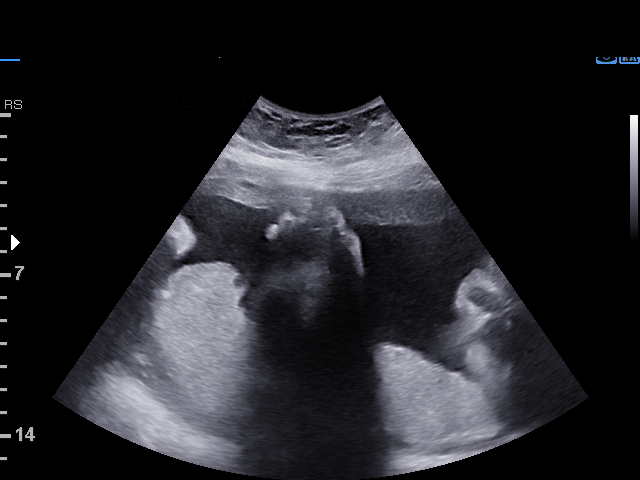

[14 of 28 positions shown; findings below may reference images not displayed]

1  LOREEN DOLO            [id number]      [id number]     [id number]
2  SOY GOTT              [id number]      [id number]     [id number]
Indications

36 weeks gestation of pregnancy
Hypertension - Chronic/Pre-existing; ASA;
labetalol
Advanced maternal age primigravida 35+,
second trimester; declined testing
Obesity complicating pregnancy, second
trimester
OB History

Blood Type:            Height:  5'8"   Weight (lb):  280       BMI:
Gravidity:    2         Term:   0         SAB:   1
Fetal Evaluation

Num Of Fetuses:     1
Fetal Heart         148
Rate(bpm):
Cardiac Activity:   Observed
Presentation:       Cephalic
Placenta:           Posterior, above cervical os
P. Cord Insertion:  Previously Visualized

Amniotic Fluid
AFI FV:      Subjectively within normal limits

AFI Sum(cm)     %Tile       Largest Pocket(cm)
17.66           66

RUQ(cm)       RLQ(cm)       LUQ(cm)        LLQ(cm)
5.47
Biophysical Evaluation
Amniotic F.V:   Pocket => 2 cm two         F. Tone:        Observed
planes
F. Movement:    Observed                   Score:          [DATE]
F. Breathing:   Observed
Biometry

BPD:      86.4  mm     G. Age:  34w 6d         19  %    CI:        70.82   %    70 - 86
FL/HC:      21.2   %    20.1 -
HC:      327.2  mm     G. Age:  37w 1d         39  %    HC/AC:      1.01        0.93 -
AC:      324.7  mm     G. Age:  36w 3d         60  %    FL/BPD:     80.4   %    71 - 87
FL:       69.5  mm     G. Age:  35w 5d         28  %    FL/AC:      21.4   %    20 - 24
HUM:      58.8  mm     G. Age:  34w 0d         24  %

Est. FW:    4924  gm      6 lb 5 oz     59  %
Gestational Age

LMP:           37w 6d        Date:  03/28/17                 EDD:   01/02/18
U/S Today:     36w 0d                                        EDD:   01/15/18
Best:          36w 3d     Det. By:  U/S  (08/14/17)          EDD:   01/12/18
Anatomy

Cranium:               Previously seen        Aortic Arch:            Previously seen
Cavum:                 Previously seen        Ductal Arch:            Previously seen
Ventricles:            Appears normal         Diaphragm:              Previously seen
Choroid Plexus:        Previously seen        Stomach:                Appears normal, left
sided
Cerebellum:            Previously seen        Abdomen:                Appears normal
Posterior Fossa:       Previously seen        Abdominal Wall:         Previously seen
Nuchal Fold:           Not applicable (>20    Cord Vessels:           Previously seen
wks GA)
Face:                  Orbits and profile     Kidneys:                Appear normal
previously seen
Lips:                  Previously seen        Bladder:                Appears normal
Thoracic:              Appears normal         Spine:                  Previously seen
Heart:                 Previously seen        Upper Extremities:      Previously seen
RVOT:                  Previously seen        Lower Extremities:      Previously seen
LVOT:                  Previously seen

Other:  Female gender. Rt 5th digit prev. visualized. Heels prev. visualized.
Nasal bone prev.visualized. Open hands prev. visualized. Technically
difficult due to maternal habitus and fetal position.
Cervix Uterus Adnexa

Cervix
Not visualized (advanced GA >58wks)

Uterus
No abnormality visualized.

Left Ovary
Within normal limits.

Right Ovary
Not visualized.

Adnexa:       No abnormality visualized. No adnexal mass
visualized.
Impression

SIUP at 36+3 weeks
Normal interval anatomy; anatomic survey complete
Normal amniotic fluid volume
Appropriate interval growth with EFW at the 59th %tile
BPP [DATE]
Recommendations

Continue antenatal testing

## 2019-07-23 IMAGING — US US FETAL BPP W/ NON-STRESS
1 series · 13 of 14 positions shown · non-contrast
Comparison: none

[Series 1: us fetal bpp w/nonstress · 14 acquisitions, 13 frames shown]
[im 1/14]
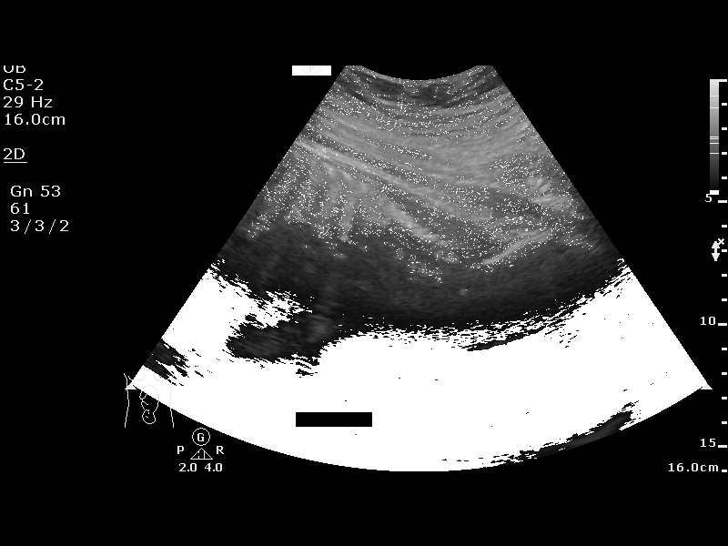
[im 2/14]
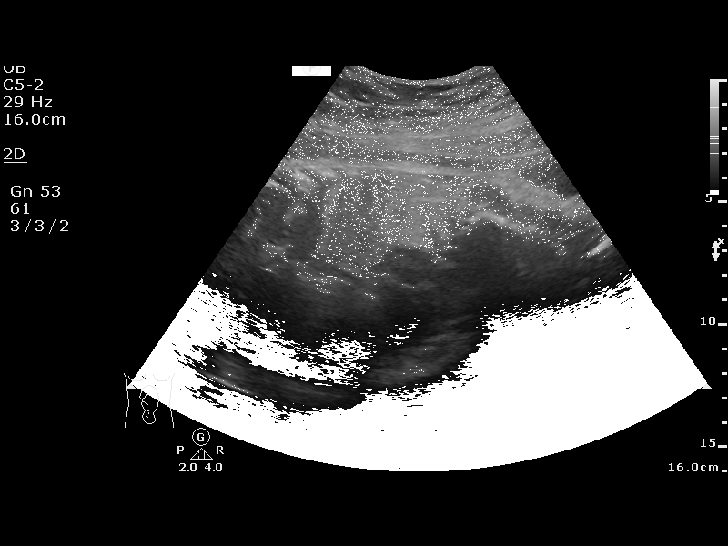
[im 3/14]
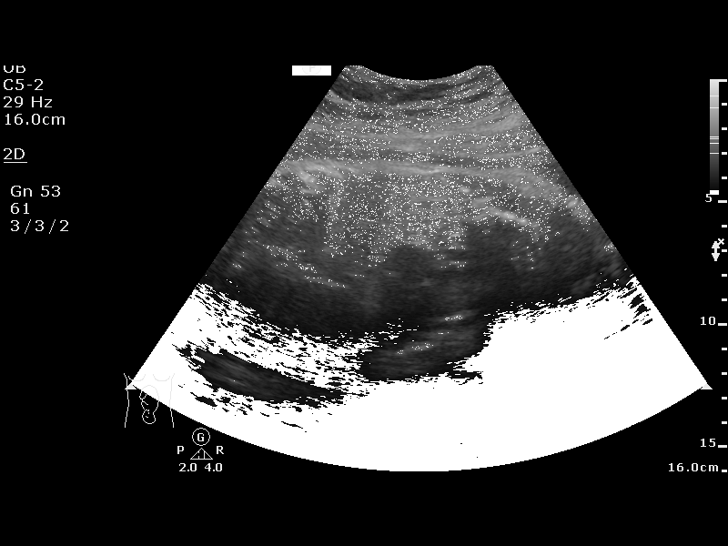
[im 4/14]
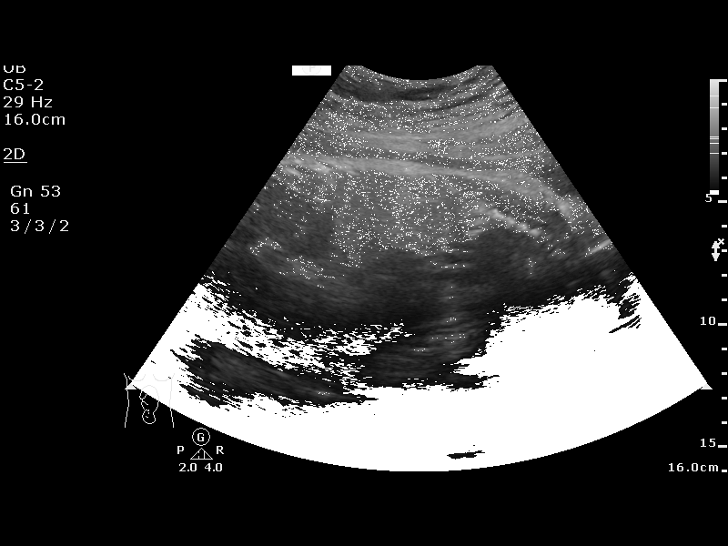
[im 5/14]
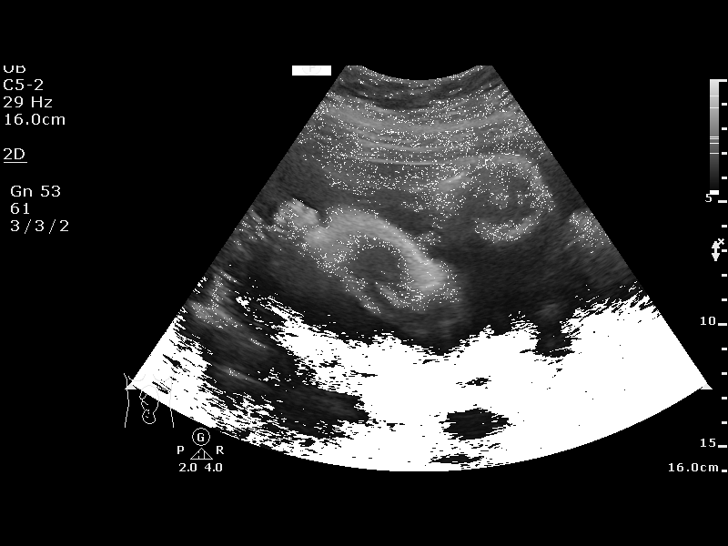
[im 6/14]
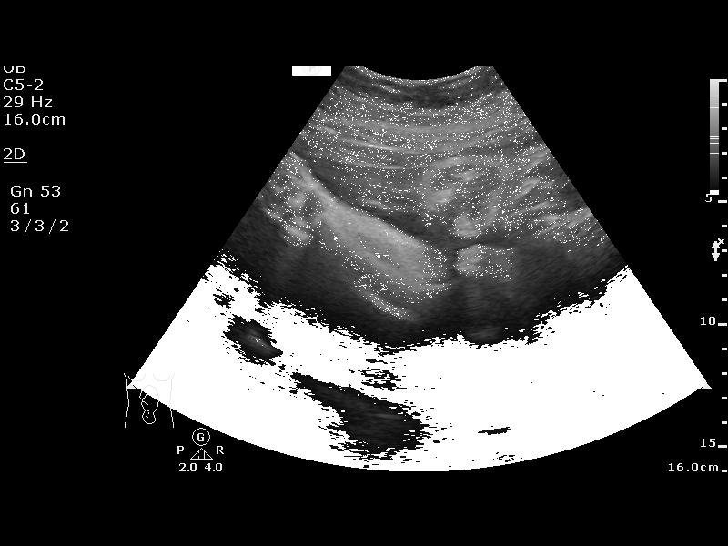
[im 8/14]
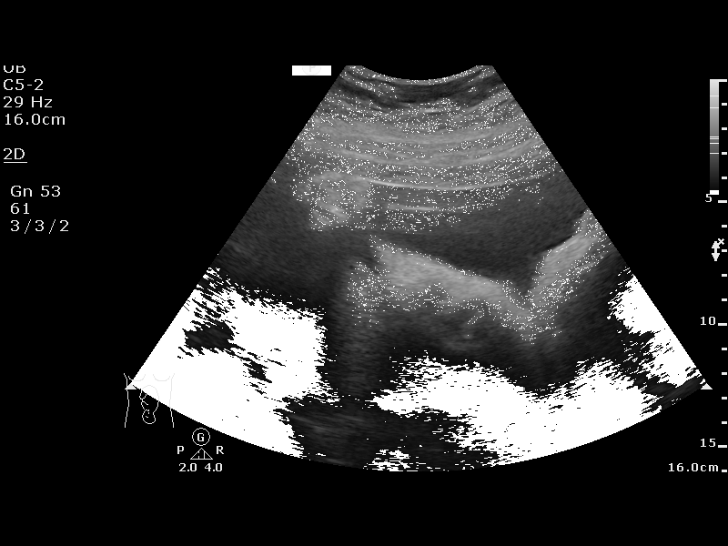
[im 9/14]
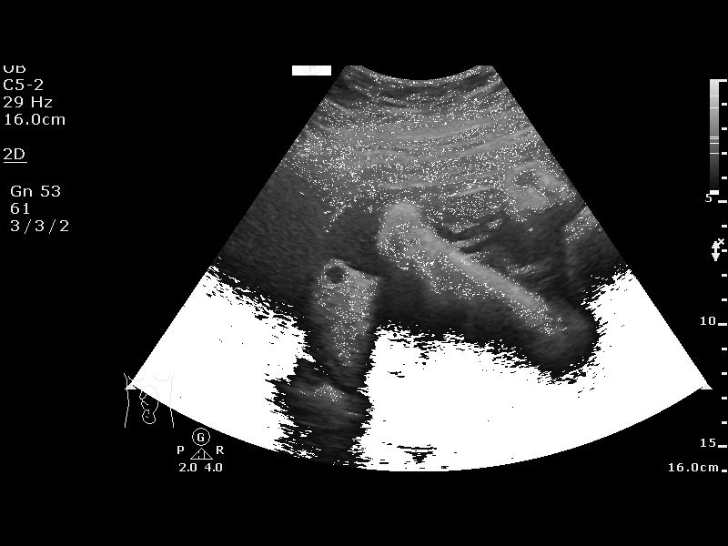
[im 10/14]
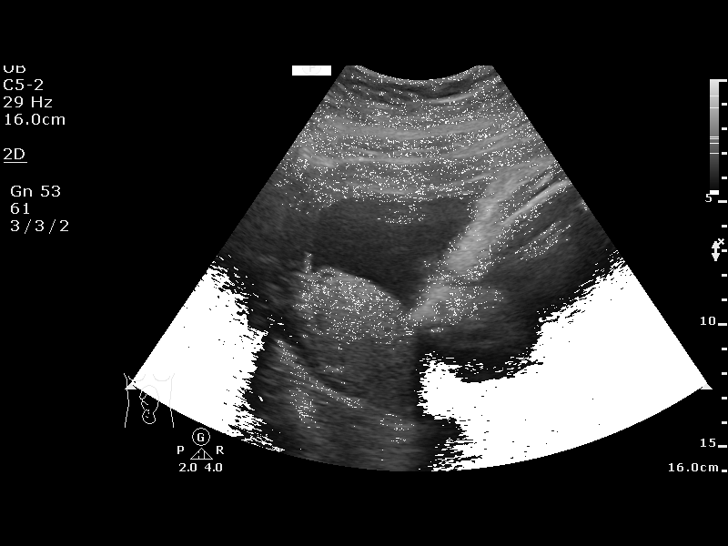
[im 11/14]
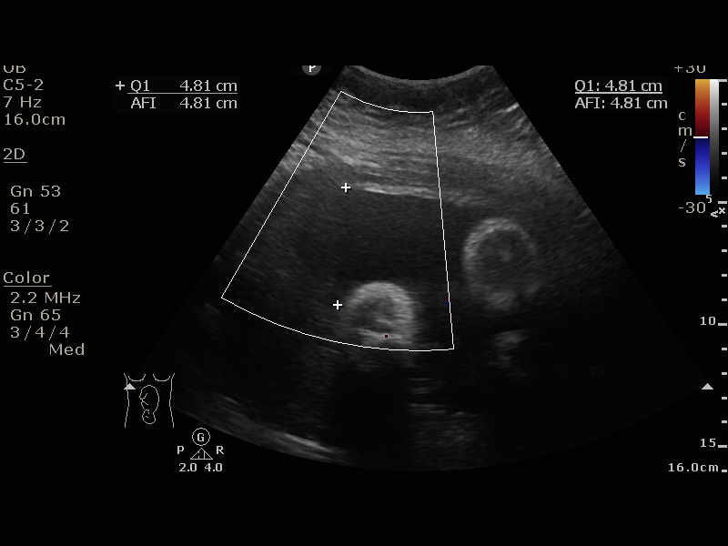
[im 12/14]
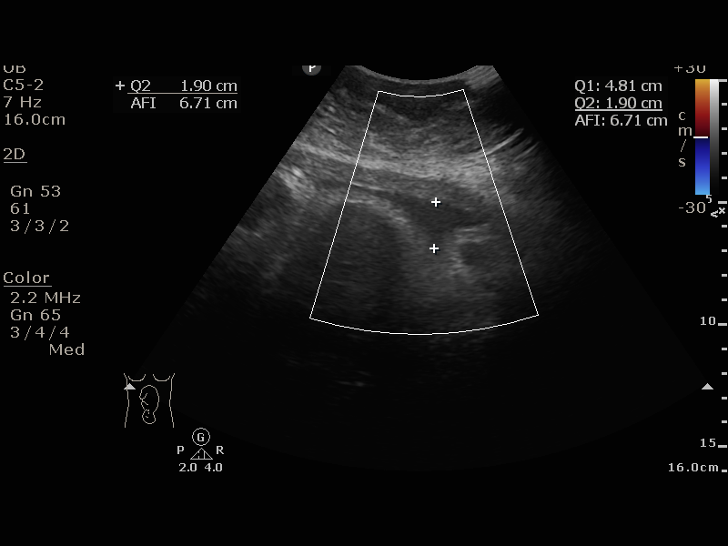
[im 13/14]
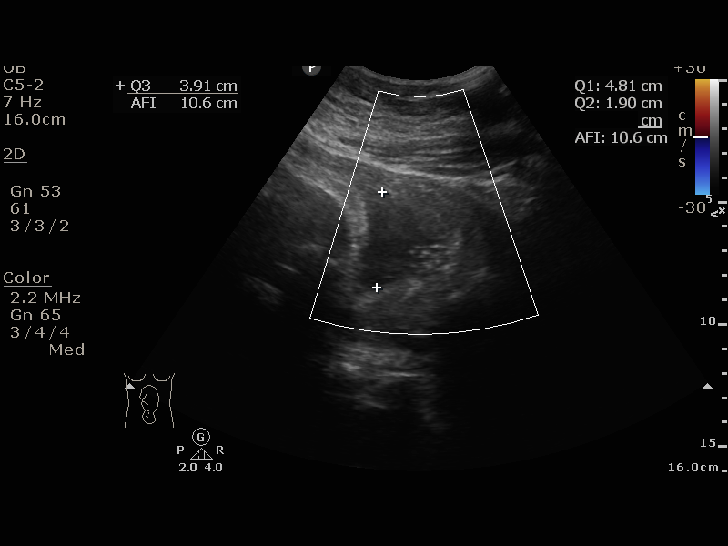
[im 14/14]
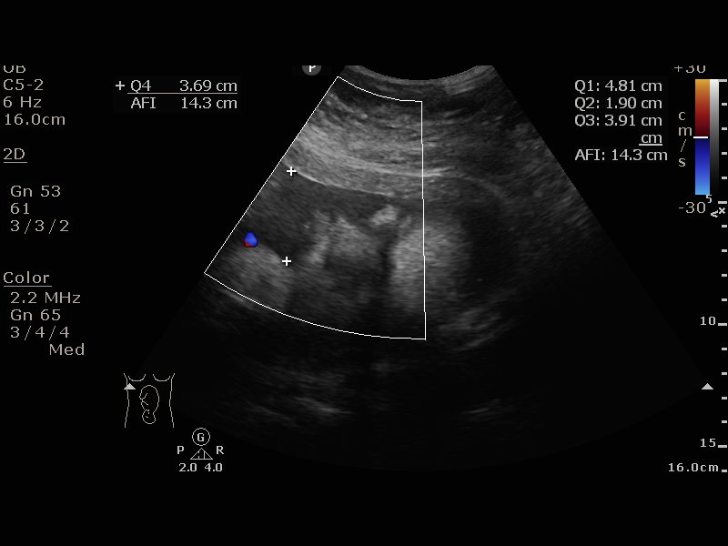

[13 of 14 positions shown; findings below may reference images not displayed]

[facility]
[REDACTED]

1  US FETAL BPP W/NONSTRESS                    [id number]

1  ARTJAN HAFKAMP              [id number]      [id number]     [id number]
Service(s) Provided

Indications

38 weeks gestation of pregnancy
Unspecified pre-existing hypertension
complicating pregnancy, third trimester
OB History

Blood Type:            Height:  5'8"   Weight (lb):  280       BMI:
Gravidity:    2         Term:   0         SAB:   1
Fetal Evaluation

Num Of Fetuses:     1
Preg. Location:     Intrauterine
Cardiac Activity:   Observed
Presentation:       Cephalic

Amniotic Fluid
AFI FV:      Subjectively within normal limits

AFI Sum(cm)     %Tile       Largest Pocket(cm)
14.31           57
RUQ(cm)       RLQ(cm)       LUQ(cm)        LLQ(cm)
4.81
Biophysical Evaluation

Amniotic F.V:   Pocket => 2 cm two         F. Tone:        Observed
planes
F. Movement:    Observed                   N.S.T:          Reactive
F. Breathing:   Observed                   Score:          [DATE]
Gestational Age

LMP:           40w 1d        Date:  03/28/17                 EDD:   01/02/18
Best:          38w 5d     Det. By:  U/S  (08/14/17)          EDD:   01/12/18
Comments

Technically limited exam due to maternal body habitus.
Impression

BPP [DATE]
Recommendations

For IOL at 39 wks.

## 2021-12-20 ENCOUNTER — Encounter: Payer: Self-pay | Admitting: *Deleted

## 2022-12-05 ENCOUNTER — Ambulatory Visit (INDEPENDENT_AMBULATORY_CARE_PROVIDER_SITE_OTHER): Payer: Medicaid Other | Admitting: *Deleted

## 2022-12-05 ENCOUNTER — Ambulatory Visit: Payer: Medicaid Other

## 2022-12-05 VITALS — BP 166/106 | HR 85

## 2022-12-05 DIAGNOSIS — Z3201 Encounter for pregnancy test, result positive: Secondary | ICD-10-CM

## 2022-12-05 DIAGNOSIS — I10 Essential (primary) hypertension: Secondary | ICD-10-CM

## 2022-12-05 DIAGNOSIS — N912 Amenorrhea, unspecified: Secondary | ICD-10-CM | POA: Diagnosis not present

## 2022-12-05 DIAGNOSIS — O0990 Supervision of high risk pregnancy, unspecified, unspecified trimester: Secondary | ICD-10-CM

## 2022-12-05 LAB — POCT URINE PREGNANCY: Preg Test, Ur: POSITIVE — AB

## 2022-12-05 MED ORDER — ASPIRIN 81 MG PO TBEC: 81.0000 mg | DELAYED_RELEASE_TABLET | Freq: Every day | ORAL | 2 refills | Status: AC

## 2022-12-05 MED ORDER — NIFEDIPINE ER OSMOTIC RELEASE 30 MG PO TB24: 30.0000 mg | ORAL_TABLET | Freq: Every day | ORAL | 1 refills | Status: DC

## 2022-12-05 NOTE — Progress Notes (Signed)
Felicia Brennan presents today for UPT. She has no unusual complaints. Severe range BP noted on arrival. . LMP: 09/23/22    OBJECTIVE: Appears well, in no apparent distress.  OB History     Gravida  2   Para  1   Term  1   Preterm      AB  1   Living  1      SAB  1   IAB      Ectopic      Multiple  0   Live Births  1          Home UPT Result: positive In-Office UPT result: positive I have reviewed the patient's medical, obstetrical, social, and family histories, and medications.   Blood pressure repeated after 10 min interval and remains elevated.  ASSESSMENT: Positive pregnancy test. Hypertension. Dr. Alysia Penna reviewed BP readings.  PLAN Prenatal care to be completed at: [facility] RX Procardia 30 mg XL and BASA sent per Dr. Alysia Penna. Pt will return in 1 week for repeat BP and OB Intake/ [place]. Pt given HTN precautions and verbalized understanding. Pt given SAB and ectopic precautions as well.

## 2022-12-08 ENCOUNTER — Ambulatory Visit: Payer: Medicaid Other

## 2022-12-13 ENCOUNTER — Ambulatory Visit (INDEPENDENT_AMBULATORY_CARE_PROVIDER_SITE_OTHER): Payer: Medicaid Other | Admitting: *Deleted

## 2022-12-13 ENCOUNTER — Other Ambulatory Visit: Payer: Self-pay | Admitting: Obstetrics & Gynecology

## 2022-12-13 ENCOUNTER — Other Ambulatory Visit (INDEPENDENT_AMBULATORY_CARE_PROVIDER_SITE_OTHER): Payer: Medicaid Other

## 2022-12-13 VITALS — BP 175/97 | HR 99 | Wt 311.0 lb

## 2022-12-13 DIAGNOSIS — O0990 Supervision of high risk pregnancy, unspecified, unspecified trimester: Secondary | ICD-10-CM

## 2022-12-13 DIAGNOSIS — O0991 Supervision of high risk pregnancy, unspecified, first trimester: Secondary | ICD-10-CM

## 2022-12-13 DIAGNOSIS — I10 Essential (primary) hypertension: Secondary | ICD-10-CM

## 2022-12-13 DIAGNOSIS — Z3A10 10 weeks gestation of pregnancy: Secondary | ICD-10-CM | POA: Diagnosis not present

## 2022-12-13 DIAGNOSIS — O3680X0 Pregnancy with inconclusive fetal viability, not applicable or unspecified: Secondary | ICD-10-CM

## 2022-12-13 MED ORDER — PRENATAL 28-0.8 MG PO TABS: 1.0000 | ORAL_TABLET | Freq: Every day | ORAL | 12 refills | Status: AC

## 2022-12-13 MED ORDER — NIFEDIPINE ER OSMOTIC RELEASE 60 MG PO TB24
60.0000 mg | ORAL_TABLET | Freq: Every day | ORAL | 2 refills | Status: DC
Start: 2022-12-13 — End: 2023-01-26

## 2022-12-13 NOTE — Patient Instructions (Signed)
[facility] has a partnership with the [employer] to provide prenatal navigation for the most needed resources in our community. In order to see how we can help connect you to these resources we need consent to contact you. Please complete the very short consent using the link below:   English Link: https://[email or web address].tfaforms.[email or web address]?site=16  Spanish Link: https://[place].tfaforms.net/287?site=16   Options for Doula Care in the [place] Area  As you review your birthing options, consider having a birth doula. A doula is trained to provide support before, during and just after you give birth. There are also postpartum doulas that help you adjust to new parenthood.  While doulas do not provide medical care, they do provide emotional, physical and educational support. A few months before your baby arrives, doulas can help answer questions, ease concerns and help you create and support your birthing plan.    Doulas can help reduce your stress and comfort you and your partner. They can help you cope with labor by helping you use breathing techniques, massage, creative labor positioning, essential oils and affirmations.   Studies show that the benefits of having a doula include:   A more positive birth experience  Fewer requests for pain-relief medication  Less likelihood of cesarean section, commonly called a c-section   Doulas are typically hired via a fee and contract between you and the doula. We are happy to provide a list of the most active doulas in the area, all of whom are credentialed by [facility] and will not count as a visitor at your birth.  There are several options for no-cost doula care at our hospital, including:  [facility] Volunteer Doula Program Every Baby [place] Doula Program A Cure 4 Moms Doula Study (available only at [facility], [facility], [place] and [place] [facility] offices)  For more information on these programs or to receive  a list of doulas active in our area, please email [email or web address]        

## 2022-12-13 NOTE — Progress Notes (Signed)
New OB Intake  I connected with Felicia Brennan  on 12/13/22 at  3:10 PM EDT by In Person Visit and verified that I am speaking with the correct person using two identifiers. Nurse is located at [facility] and pt is located at [place].  I discussed the limitations, risks, security and privacy concerns of performing an evaluation and management service by telephone and the availability of in person appointments. I also discussed with the patient that there may be a patient responsible charge related to this service. The patient expressed understanding and agreed to proceed.  I explained I am completing New OB Intake today. We discussed EDD of 07/09/23 that is based on [place] on 12/13/22 at [redacted]w[redacted]d. Pt is G3/P1. I reviewed her allergies, medications, Medical/Surgical/OB history, and appropriate screenings. I informed her of [facility] services. [facility] information placed in AVS. Based on history, this is a high risk pregnancy.  Patient Active Problem List   Diagnosis Date Noted   Secondary hypertension 02/26/2018   Screening cholesterol level 02/26/2018   Class 3 severe obesity with serious comorbidity and body mass index (BMI) of 40.0 to 44.9 in adult [facility]) 02/26/2018   Marijuana smoker, episodic 02/26/2018   Tobacco use disorder 02/26/2018   Status post primary low transverse cesarean section 01/07/2018   Abnormal quad screen 07/27/2017   Trichomonal vaginitis in pregnancy 07/25/2017   Abnormal Pap smear of cervix 07/25/2017    Concerns addressed today  Delivery Plans Plans to deliver at [facility]. Patient given information for [facility] Healthy Baby website for more information about [facility]. Patient  is not a candidate for  water birth  MyChart/Babyscripts MyChart access verified. I explained pt will have some visits in office and some virtually. Babyscripts instructions given and order placed. Patient verifies receipt of registration text/e-mail. Account successfully created and app  downloaded.  Blood Pressure Cuff/Weight Scale Patient is self-pay; explained patient will be given BP cuff at first prenatal appt. Explained after first prenatal appt pt will check weekly and document in Babyscripts. Patient does not have weight scale; patient may purchase if they desire to track weight weekly in Babyscripts.  Anatomy US Explained first scheduled [place] will be around 19 weeks. Anatomy US scheduled for 19 wks at [facility]. Pt notified to arrive at [facility].  Labs Discussed Avelina Laine genetic screening with patient. Would like both Panorama and Horizon drawn at new OB visit. Routine prenatal labs needed.  COVID Vaccine Patient has had COVID vaccine.   Social Determinants of Health Food Insecurity: Patient denies food insecurity. WIC Referral: Patient is interested in referral to [facility].  Transportation: Patient denies transportation needs. Childcare: Discussed no children allowed at ultrasound appointments. Offered childcare services; patient declines childcare services at this time.  Interested in [place]? If yes, send referral and doula dot phrase.   First visit review I reviewed new OB appt with patient. I explained they will have a provider visit that includes Pap, GC/CC, Natera, discussion. Explained pt will be seen by Dr. Donavan Foil at first visit; encounter routed to appropriate provider. Explained that patient will be seen by pregnancy navigator following visit with provider.   Harrel Lemon, RN 12/13/2022  3:34 PM

## 2022-12-13 NOTE — Progress Notes (Signed)
Severe range BP discussed with Dr. Debroah Loop. Pt has been taking Procardia 30 XL QD since 12/08/22. Orders to increase dose to 60XL. RX sent. Pt advised and verbalized understanding. Pt to return for BP check in 1 week on 12/20/22.

## 2022-12-14 ENCOUNTER — Other Ambulatory Visit (HOSPITAL_COMMUNITY)
Admission: RE | Admit: 2022-12-14 | Discharge: 2022-12-14 | Disposition: A | Discharge status: Home / Self Care | Payer: Medicaid Other | Source: Ambulatory Visit | Attending: Obstetrics & Gynecology | Admitting: Obstetrics & Gynecology

## 2022-12-14 DIAGNOSIS — O0990 Supervision of high risk pregnancy, unspecified, unspecified trimester: Secondary | ICD-10-CM | POA: Diagnosis not present

## 2022-12-14 LAB — CBC/D/PLT+RPR+RH+ABO+RUBIGG...
Antibody Screen: NEGATIVE
Basophils Absolute: 0 10*3/uL (ref 0.0–0.2)
EOS (ABSOLUTE): 0.2 10*3/uL (ref 0.0–0.4)
Hematocrit: 38 % (ref 34.0–46.6)
Hepatitis B Surface Ag: NEGATIVE
MCH: 27.4 pg (ref 26.6–33.0)
Monocytes: 6 %
RBC: 4.41 x10E6/uL (ref 3.77–5.28)

## 2022-12-14 LAB — HEMOGLOBIN A1C: Hgb A1c MFr Bld: 5.8 % — ABNORMAL HIGH (ref 4.8–5.6)

## 2022-12-14 LAB — COMPREHENSIVE METABOLIC PANEL
Albumin/Globulin Ratio: 1.1 — ABNORMAL LOW (ref 1.2–2.2)
Albumin: 3.5 g/dL — ABNORMAL LOW (ref 3.9–4.9)
BUN: 10 mg/dL (ref 6–24)
Creatinine, Ser: 0.65 mg/dL (ref 0.57–1.00)
Potassium: 3.9 mmol/L (ref 3.5–5.2)

## 2022-12-14 LAB — HCV INTERPRETATION

## 2022-12-14 NOTE — Addendum Note (Signed)
Addended by: Maretta Bees on: 12/14/2022 11:37 AM   Modules accepted: Orders

## 2022-12-15 LAB — CERVICOVAGINAL ANCILLARY ONLY
Bacterial Vaginitis (gardnerella): POSITIVE — AB
Candida Glabrata: NEGATIVE
Candida Vaginitis: NEGATIVE
Chlamydia: POSITIVE — AB
Comment: NEGATIVE
Comment: NEGATIVE
Comment: NEGATIVE
Comment: NEGATIVE
Comment: NEGATIVE
Comment: NORMAL
Neisseria Gonorrhea: NEGATIVE
Trichomonas: POSITIVE — AB

## 2022-12-15 LAB — URINE CULTURE, OB REFLEX

## 2022-12-15 LAB — PROTEIN / CREATININE RATIO, URINE
Creatinine, Urine: 178.6 mg/dL
Protein, Ur: 23.1 mg/dL
Protein/Creat Ratio: 129 mg/g creat (ref 0–200)

## 2022-12-15 LAB — CULTURE, OB URINE

## 2022-12-18 ENCOUNTER — Other Ambulatory Visit: Payer: Self-pay | Admitting: *Deleted

## 2022-12-18 DIAGNOSIS — A599 Trichomoniasis, unspecified: Secondary | ICD-10-CM

## 2022-12-18 DIAGNOSIS — A749 Chlamydial infection, unspecified: Secondary | ICD-10-CM

## 2022-12-18 DIAGNOSIS — B9689 Other specified bacterial agents as the cause of diseases classified elsewhere: Secondary | ICD-10-CM

## 2022-12-18 MED ORDER — METRONIDAZOLE 500 MG PO TABS
500.0000 mg | ORAL_TABLET | Freq: Two times a day (BID) | ORAL | 0 refills | Status: DC
Start: 2022-12-18 — End: 2023-01-26

## 2022-12-18 MED ORDER — AZITHROMYCIN 500 MG PO TABS
1000.0000 mg | ORAL_TABLET | Freq: Once | ORAL | 0 refills | Status: AC
Start: 2022-12-18 — End: 2022-12-18

## 2022-12-18 NOTE — Progress Notes (Signed)
TC. Pt reports unable to talk right now. Permission for [facility] message granted. Pt advised of infections and RXs and off need for partner tx and abstinence for both. Education on each infection included in message. RX Azithromycin and Flagyl sent.

## 2022-12-20 ENCOUNTER — Ambulatory Visit (INDEPENDENT_AMBULATORY_CARE_PROVIDER_SITE_OTHER): Payer: Medicaid Other

## 2022-12-20 VITALS — BP 161/97 | HR 81

## 2022-12-20 DIAGNOSIS — A749 Chlamydial infection, unspecified: Secondary | ICD-10-CM | POA: Diagnosis not present

## 2022-12-20 DIAGNOSIS — O0990 Supervision of high risk pregnancy, unspecified, unspecified trimester: Secondary | ICD-10-CM

## 2022-12-20 MED ORDER — AZITHROMYCIN 500 MG PO TABS
1000.0000 mg | ORAL_TABLET | Freq: Once | ORAL | Status: AC
Start: 2022-12-20 — End: 2022-12-20
  Administered 2022-12-20: 1000 mg via ORAL

## 2022-12-20 NOTE — Progress Notes (Signed)
Subjective:  Felicia Brennan is a 42 y.o. female here for BP check.   Hypertension ROS: taking medications as instructed, no medication side effects noted, no TIA's, no chest pain on exertion, no dyspnea on exertion, and no swelling of ankles.    Objective:  LMP 09/23/2022 (Approximate)   Appearance alert, well appearing, and in no distress. General exam BP noted to be well controlled today in office.    Assessment:   Blood Pressure needs improvement.   Plan:  Per Dr. Berton Lan. Patient is to increase Procardia XL 60 mg to twice per day. Patient is to report BP in baby rx or mychart next week,   Patient unable to afford STD treatment at this time due to no insurance and waiting on paycheck. Patient encouraged to apply for pregnancy medicaid to help cover cost of treatment and appointments. Patient provided resources on how to apply. Advised to do so ASAP.  Azithromycin given to patient here in the office for positive chlamydia.

## 2023-01-08 ENCOUNTER — Ambulatory Visit (INDEPENDENT_AMBULATORY_CARE_PROVIDER_SITE_OTHER): Payer: Self-pay | Admitting: Obstetrics and Gynecology

## 2023-01-08 ENCOUNTER — Other Ambulatory Visit (HOSPITAL_COMMUNITY)
Admission: RE | Admit: 2023-01-08 | Discharge: 2023-01-08 | Disposition: A | Discharge status: Home / Self Care | Payer: Medicaid Other | Source: Ambulatory Visit | Attending: Obstetrics and Gynecology | Admitting: Obstetrics and Gynecology

## 2023-01-08 VITALS — BP 163/102 | HR 105 | Wt 320.6 lb

## 2023-01-08 DIAGNOSIS — O0990 Supervision of high risk pregnancy, unspecified, unspecified trimester: Secondary | ICD-10-CM | POA: Insufficient documentation

## 2023-01-08 DIAGNOSIS — O09892 Supervision of other high risk pregnancies, second trimester: Secondary | ICD-10-CM

## 2023-01-08 DIAGNOSIS — O0992 Supervision of high risk pregnancy, unspecified, second trimester: Secondary | ICD-10-CM

## 2023-01-08 DIAGNOSIS — O09522 Supervision of elderly multigravida, second trimester: Secondary | ICD-10-CM

## 2023-01-08 DIAGNOSIS — O09292 Supervision of pregnancy with other poor reproductive or obstetric history, second trimester: Secondary | ICD-10-CM

## 2023-01-08 DIAGNOSIS — Z3009 Encounter for other general counseling and advice on contraception: Secondary | ICD-10-CM

## 2023-01-08 DIAGNOSIS — Z6841 Body Mass Index (BMI) 40.0 and over, adult: Secondary | ICD-10-CM

## 2023-01-08 DIAGNOSIS — Z2839 Other underimmunization status: Secondary | ICD-10-CM

## 2023-01-08 DIAGNOSIS — O09299 Supervision of pregnancy with other poor reproductive or obstetric history, unspecified trimester: Secondary | ICD-10-CM

## 2023-01-08 DIAGNOSIS — Z98891 History of uterine scar from previous surgery: Secondary | ICD-10-CM

## 2023-01-08 DIAGNOSIS — O09529 Supervision of elderly multigravida, unspecified trimester: Secondary | ICD-10-CM

## 2023-01-08 DIAGNOSIS — Z3A14 14 weeks gestation of pregnancy: Secondary | ICD-10-CM

## 2023-01-08 DIAGNOSIS — I159 Secondary hypertension, unspecified: Secondary | ICD-10-CM

## 2023-01-08 DIAGNOSIS — F1290 Cannabis use, unspecified, uncomplicated: Secondary | ICD-10-CM

## 2023-01-08 MED ORDER — LABETALOL HCL 200 MG PO TABS
200.0000 mg | ORAL_TABLET | Freq: Two times a day (BID) | ORAL | 3 refills | Status: DC
Start: 2023-01-08 — End: 2023-02-02

## 2023-01-08 NOTE — Progress Notes (Signed)
INITIAL PRENATAL VISIT NOTE  Subjective:  Felicia Brennan is a 42 y.o. G3P1011 at [redacted]w[redacted]d by early ultrasound being seen today for her initial prenatal visit. She has an obstetric history significant for cesarean section x 1 and HELLP. She has a medical history significant for morbid obesity and chronic hypertension.  Patient reports no complaints.  Contractions: Not present. Vag. Bleeding: None.   . Denies leaking of fluid.   Reviewed  labs and discussed with the patient.  She is adamant that she did not have any blood drawn.  Will redraw today.   Past Medical History:  Diagnosis Date   Hypertension    Obese    Trichimoniasis 01/03/2018   tx 01/05/18   Vaginal Pap smear, abnormal     Past Surgical History:  Procedure Laterality Date   CESAREAN SECTION N/A 01/07/2018   Procedure: CESAREAN SECTION;  Surgeon: Welch Bing, MD;  Location: [facility] BIRTHING SUITES;  Service: Obstetrics;  Laterality: N/A;    OB History  Gravida Para Term Preterm AB Living  3 1 1   1 1   SAB IAB Ectopic Multiple Live Births  1 0 0 0 1    # Outcome Date GA Lbr Len/2nd Weight Sex Delivery Anes PTL Lv  3 Current           2 Term 01/07/18 [redacted]w[redacted]d  5 lb 15.4 oz (2.705 kg) F CS-LTranv Spinal  LIV  1 SAB 07/16/12       N     Social History   Socioeconomic History   Marital status: Single    Spouse name: Not on file   Number of children: Not on file   Years of education: Not on file   Highest education level: Not on file  Occupational History   Not on file  Tobacco Use   Smoking status: Some Days    Packs/day: .25    Types: Cigarettes   Smokeless tobacco: Never  Vaping Use   Vaping Use: Never used  Substance and Sexual Activity   Alcohol use: Yes    Alcohol/week: 1.0 standard drink of alcohol    Types: 1 Glasses of wine per week   Drug use: Yes    Frequency: 2.0 times per week    Types: Marijuana   Sexual activity: Not Currently    Birth control/protection: None  Other Topics Concern    Not on file  Social History Narrative   Not on file   Social Determinants of Health   Financial Resource Strain: Not on file  Food Insecurity: Not on file  Transportation Needs: Not on file  Physical Activity: Not on file  Stress: Not on file  Social Connections: Not on file    Family History  Problem Relation Age of Onset   Hypertension Mother    Pancreatic cancer Mother    Cancer Father    COPD Father    Lung cancer Father      Current Outpatient Medications:    labetalol (NORMODYNE) 200 MG tablet, Take 1 tablet (200 mg total) by mouth 2 (two) times daily., Disp: 60 tablet, Rfl: 3   NIFEdipine (PROCARDIA XL/NIFEDICAL XL) 60 MG 24 hr tablet, Take 1 tablet (60 mg total) by mouth daily., Disp: 30 tablet, Rfl: 2   Prenatal 28-0.8 MG TABS, Take 1 tablet by mouth daily., Disp: 30 tablet, Rfl: 12   aspirin EC 81 MG tablet, Take 1 tablet (81 mg total) by mouth daily. Take after 12 weeks for prevention of preeclampsia later  in pregnancy (Patient not taking: Reported on 12/13/2022), Disp: 300 tablet, Rfl: 2   metroNIDAZOLE (FLAGYL) 500 MG tablet, Take 1 tablet (500 mg total) by mouth 2 (two) times daily. (Patient not taking: Reported on 01/08/2023), Disp: 14 tablet, Rfl: 0  Allergies  Allergen Reactions   Pistachio Nut (Diagnostic) Itching and Swelling    Throat itching and a little swelling    Review of Systems: Negative except for what is mentioned in HPI.  Objective:   Vitals:   01/08/23 1110 01/08/23 1147  BP: (!) 157/93 (!) 163/102  Pulse: (!) 105   Weight: (!) 320 lb 9.6 oz (145.4 kg)     Fetal Status: Fetal Heart Rate (bpm): 155         Physical Exam: BP (!) 163/102   Pulse (!) 105   Wt (!) 320 lb 9.6 oz (145.4 kg)   LMP 09/23/2022 (Approximate)   BMI 48.75 kg/m  CONSTITUTIONAL: Well-developed, obese, well-nourished female in no acute distress.  NEUROLOGIC: Alert and oriented to person, place, and time. Normal reflexes, muscle tone coordination. No cranial nerve  deficit noted. PSYCHIATRIC: Normal mood and affect. Normal behavior. Normal judgment and thought content. SKIN: Skin is warm and dry. No rash noted. Not diaphoretic. No erythema. No pallor.  Hirsuitism hair pattern noted. HENT:  Normocephalic, atraumatic, External right and left ear normal. Oropharynx is clear and moist EYES: Conjunctivae and EOM are normal.  NECK: Normal range of motion, supple, no masses CARDIOVASCULAR: Normal heart rate noted, regular rhythm RESPIRATORY: Effort and breath sounds normal, no problems with respiration noted BREASTS: deferred ABDOMEN: Soft, nontender, nondistended, gravid. GU: normal appearing external female genitalia, multiparous, normal appearing cervix, scant white discharge in vagina, no lesions noted, pap taken without incident Bimanual: 13 weeks sized uterus, no adnexal tenderness or palpable lesions noted MUSCULOSKELETAL: Normal range of motion. EXT:  No edema and no tenderness. 2+ distal pulses.   Assessment and Plan:  Pregnancy: G3P1011 at [redacted]w[redacted]d by ultrasound  1. [redacted] weeks gestation of pregnancy   2. Secondary hypertension Add labetalol, to HTN management, recheck in 2 weeks - Comprehensive metabolic panel - labetalol (NORMODYNE) 200 MG tablet; Take 1 tablet (200 mg total) by mouth 2 (two) times daily.  Dispense: 60 tablet; Refill: 3  3. Supervision of high risk pregnancy, antepartum Continue routine prenatal care - Cytology - PAP( [place]) - PANORAMA PRENATAL TEST - HORIZON CUSTOM - Comprehensive metabolic panel - CBC/D/Plt+RPR+Rh+ABO+RubIgG...  4. Status post primary low transverse cesarean section Pt desires repeat c section with BTL  5. Marijuana smoker, episodic   6. BMI 45.0-49.9, adult (HCC)   7. History of HELLP syndrome, currently pregnant Pt to start on baby ASA at 15 weeks with 162 gram per day  8. Antepartum multigravida of advanced maternal age    Preterm labor symptoms and general obstetric precautions  including but not limited to vaginal bleeding, contractions, leaking of fluid and fetal movement were reviewed in detail with the patient.  Please refer to After Visit Summary for other counseling recommendations.   Return in about 2 weeks (around 01/22/2023) for [facility], in person.  Warden Fillers 01/08/2023 1:05 PM

## 2023-01-08 NOTE — Progress Notes (Signed)
NOB in office, hx of CHTN,pt denies any abnormal symptoms today. Reports that she has been taking Nifedipine BID.

## 2023-01-09 LAB — CBC/D/PLT+RPR+RH+ABO+RUBIGG...
Antibody Screen: NEGATIVE
Basophils Absolute: 0 10*3/uL (ref 0.0–0.2)
Basos: 0 %
EOS (ABSOLUTE): 0.2 10*3/uL (ref 0.0–0.4)
Eos: 2 %
HCV Ab: NONREACTIVE
HIV Screen 4th Generation wRfx: NONREACTIVE
Hematocrit: 32.2 % — ABNORMAL LOW (ref 34.0–46.6)
Hemoglobin: 10.6 g/dL — ABNORMAL LOW (ref 11.1–15.9)
Hepatitis B Surface Ag: NEGATIVE
Immature Grans (Abs): 0 10*3/uL (ref 0.0–0.1)
Immature Granulocytes: 0 %
Lymphocytes Absolute: 2.9 10*3/uL (ref 0.7–3.1)
Lymphs: 28 %
MCH: 29.6 pg (ref 26.6–33.0)
MCHC: 32.9 g/dL (ref 31.5–35.7)
MCV: 90 fL (ref 79–97)
Monocytes Absolute: 0.5 10*3/uL (ref 0.1–0.9)
Monocytes: 5 %
Neutrophils Absolute: 6.7 10*3/uL (ref 1.4–7.0)
Neutrophils: 65 %
Platelets: 246 10*3/uL (ref 150–450)
RBC: 3.58 x10E6/uL — ABNORMAL LOW (ref 3.77–5.28)
RDW: 12 % (ref 11.7–15.4)
RPR Ser Ql: NONREACTIVE
Rh Factor: POSITIVE
Rubella Antibodies, IGG: <0.9 index — ABNORMAL LOW (ref >0.99)
WBC: 10.4 10*3/uL (ref 3.4–10.8)

## 2023-01-09 LAB — COMPREHENSIVE METABOLIC PANEL
ALT: 6 IU/L (ref 0–32)
AST: 13 IU/L (ref 0–40)
Albumin: 3.7 g/dL — ABNORMAL LOW (ref 3.9–4.9)
Alkaline Phosphatase: 62 IU/L (ref 44–121)
BUN/Creatinine Ratio: 12 (ref 9–23)
BUN: 7 mg/dL (ref 6–24)
Bilirubin Total: <0.2 mg/dL (ref 0.0–1.2)
CO2: 21 mmol/L (ref 20–29)
Calcium: 9.1 mg/dL (ref 8.7–10.2)
Chloride: 105 mmol/L (ref 96–106)
Creatinine, Ser: 0.6 mg/dL (ref 0.57–1.00)
Globulin, Total: 2.9 g/dL (ref 1.5–4.5)
Glucose: 87 mg/dL (ref 70–99)
Potassium: 3.5 mmol/L (ref 3.5–5.2)
Sodium: 139 mmol/L (ref 134–144)
Total Protein: 6.6 g/dL (ref 6.0–8.5)
eGFR: 116 mL/min/{1.73_m2} (ref >59)

## 2023-01-09 LAB — HCV INTERPRETATION

## 2023-01-10 LAB — CYTOLOGY - PAP
Comment: NEGATIVE
Diagnosis: NEGATIVE
High risk HPV: NEGATIVE

## 2023-01-11 DIAGNOSIS — Z2839 Other underimmunization status: Secondary | ICD-10-CM | POA: Insufficient documentation

## 2023-01-16 LAB — PANORAMA PRENATAL TEST FULL PANEL:PANORAMA TEST PLUS 5 ADDITIONAL MICRODELETIONS: FETAL FRACTION: 4.3

## 2023-01-16 NOTE — Progress Notes (Signed)
Order(s) created erroneously. Erroneous order ID: [id number]  Order moved by: CHART CORRECTION ANALYST, FIFTEEN  Order move date/time: 01/16/2023 1:53 PM  Source Patient: [id number]  Source Contact: 12/13/2022  Destination Patient: [id number]  Destination Contact: 12/26/2021  Erroneous order ID: [id number]  Order moved by: CHART CORRECTION ANALYST, FIFTEEN  Order move date/time: 01/16/2023 1:53 PM  Source Patient: [id number]  Source Contact: 12/13/2022  Destination Patient: [id number]  Destination Contact: 12/26/2021  Erroneous order ID: [id number]  Order moved by: CHART CORRECTION ANALYST, FIFTEEN  Order move date/time: 01/16/2023 1:53 PM  Source Patient: [id number]  Source Contact: 12/13/2022  Destination Patient: [id number]  Destination Contact: 12/26/2021  Erroneous order ID: [id number]  Order moved by: CHART CORRECTION ANALYST, FIFTEEN  Order move date/time: 01/16/2023 1:53 PM  Source Patient: [id number]  Source Contact: 12/13/2022  Destination Patient: [id number]  Destination Contact: 12/26/2021

## 2023-01-19 ENCOUNTER — Encounter: Payer: Self-pay | Admitting: Obstetrics and Gynecology

## 2023-01-19 DIAGNOSIS — D563 Thalassemia minor: Secondary | ICD-10-CM | POA: Insufficient documentation

## 2023-01-19 LAB — HORIZON CUSTOM: REPORT SUMMARY: POSITIVE — AB

## 2023-01-26 ENCOUNTER — Other Ambulatory Visit (HOSPITAL_COMMUNITY)
Admission: RE | Admit: 2023-01-26 | Discharge: 2023-01-26 | Disposition: A | Discharge status: Home / Self Care | Payer: Medicaid Other | Source: Ambulatory Visit | Attending: Obstetrics and Gynecology | Admitting: Obstetrics and Gynecology

## 2023-01-26 ENCOUNTER — Ambulatory Visit (INDEPENDENT_AMBULATORY_CARE_PROVIDER_SITE_OTHER): Payer: Self-pay | Admitting: Obstetrics and Gynecology

## 2023-01-26 VITALS — BP 160/91 | HR 80 | Wt 322.0 lb

## 2023-01-26 DIAGNOSIS — Z98891 History of uterine scar from previous surgery: Secondary | ICD-10-CM

## 2023-01-26 DIAGNOSIS — Z3009 Encounter for other general counseling and advice on contraception: Secondary | ICD-10-CM

## 2023-01-26 DIAGNOSIS — I159 Secondary hypertension, unspecified: Secondary | ICD-10-CM

## 2023-01-26 DIAGNOSIS — O0990 Supervision of high risk pregnancy, unspecified, unspecified trimester: Secondary | ICD-10-CM | POA: Insufficient documentation

## 2023-01-26 DIAGNOSIS — O09299 Supervision of pregnancy with other poor reproductive or obstetric history, unspecified trimester: Secondary | ICD-10-CM

## 2023-01-26 DIAGNOSIS — O23592 Infection of other part of genital tract in pregnancy, second trimester: Secondary | ICD-10-CM

## 2023-01-26 DIAGNOSIS — O09529 Supervision of elderly multigravida, unspecified trimester: Secondary | ICD-10-CM

## 2023-01-26 DIAGNOSIS — F17200 Nicotine dependence, unspecified, uncomplicated: Secondary | ICD-10-CM

## 2023-01-26 DIAGNOSIS — A5901 Trichomonal vulvovaginitis: Secondary | ICD-10-CM

## 2023-01-26 DIAGNOSIS — Z2839 Other underimmunization status: Secondary | ICD-10-CM

## 2023-01-26 DIAGNOSIS — D563 Thalassemia minor: Secondary | ICD-10-CM

## 2023-01-26 DIAGNOSIS — Z6841 Body Mass Index (BMI) 40.0 and over, adult: Secondary | ICD-10-CM

## 2023-01-26 DIAGNOSIS — O09292 Supervision of pregnancy with other poor reproductive or obstetric history, second trimester: Secondary | ICD-10-CM

## 2023-01-26 DIAGNOSIS — R87618 Other abnormal cytological findings on specimens from cervix uteri: Secondary | ICD-10-CM

## 2023-01-26 DIAGNOSIS — O09522 Supervision of elderly multigravida, second trimester: Secondary | ICD-10-CM

## 2023-01-26 DIAGNOSIS — O0992 Supervision of high risk pregnancy, unspecified, second trimester: Secondary | ICD-10-CM

## 2023-01-26 DIAGNOSIS — Z3A16 16 weeks gestation of pregnancy: Secondary | ICD-10-CM

## 2023-01-26 MED ORDER — NIFEDIPINE ER OSMOTIC RELEASE 60 MG PO TB24
60.0000 mg | ORAL_TABLET | Freq: Two times a day (BID) | ORAL | 11 refills | Status: AC
Start: 2023-01-26 — End: ?

## 2023-01-26 NOTE — Progress Notes (Signed)
Pt states she is having nosebleeds at night during sleep.  Please discuss BP meds with pt -  she may need script for Procardia based on last visit.

## 2023-01-26 NOTE — Progress Notes (Signed)
PRENATAL VISIT NOTE  Subjective:  Felicia Brennan is a 42 y.o. G3P1011 at [redacted]w[redacted]d being seen today for ongoing prenatal care.  She is currently monitored for the following issues for this high-risk pregnancy and has Antepartum multigravida of advanced maternal age; Chlamydia & trichomonal vaginitis in pregnancy; Abnormal Pap smear of cervix; Status post primary low transverse cesarean section; Secondary hypertension; Marijuana smoker, episodic; Tobacco use disorder; Supervision of high risk pregnancy, antepartum; BMI 45.0-49.9, adult (HCC); History of HELLP syndrome, currently pregnant; Unwanted fertility; Rubella non-immune status, antepartum; and Thalassemia alpha carrier on their problem list.  Patient reports carpal tunnel symptoms and nose bleeds .  Contractions: Not present. Vag. Bleeding: None.  Movement: Present. Denies leaking of fluid.   The following portions of the patient's history were reviewed and updated as appropriate: allergies, current medications, past family history, past medical history, past social history, past surgical history and problem list.   Objective:   Vitals:   01/26/23 1053 01/26/23 1104  BP: (!) 159/96 (!) 160/91  Pulse: 80   Weight: (!) 322 lb (146.1 kg)    Fetal Status: Fetal Heart Rate (bpm): 150   Movement: Present     General:  Alert, oriented and cooperative. Patient is in no acute distress.  Skin: Skin is warm and dry. No rash noted.   Cardiovascular: Normal heart rate noted  Respiratory: Normal respiratory effort, no problems with respiration noted  Abdomen: Soft, gravid, appropriate for gestational age.  Pain/Pressure: Absent      Assessment and Plan:  Pregnancy: G3P1011 at [redacted]w[redacted]d 1. Supervision of high risk pregnancy, antepartum 2. [redacted] weeks gestation of pregnancy Reviewed OTC management options for nose bleeds & carpal tunnel syndrome. Reviewed indications for urgent/emergent eval Anatomy US scheduled 8/1 AFP discussed & ordered - AFP,  Serum, Open Spina Bifida  3. Chronic hypertension 4. History of HELLP syndrome, currently pregnant Has not taken procardia for the past week due to cost. Was able to pick it up this AM, but has not taken yet. Asymptomatic. Continue procardia XL 60mg  BID & labetalol 200mg  BID Home BP monitoring - will f/u via mychart message in 1-2 weeks to see if additional meds are needed Baseline labs normal - P/C 129, Cr 0.6, ALT 6, AST 13, plt 246 Will need serial growth US/antenatal testing per MFM Given difficult to control & longstanding HTN as well as multiple risk factors for cardiovascular diease will refer to cardio OB  - AMB Referral to Cardio Obstetrics - NIFEdipine (PROCARDIA XL/NIFEDICAL XL) 60 MG 24 hr tablet; Take 1 tablet (60 mg total) by mouth 2 (two) times daily.  Dispense: 60 tablet; Refill: 11  5. Status post primary low transverse cesarean section Will discuss MOD at future appointment  6. Tobacco use disorder  7. Chlamydia & trichomonal vaginitis during pregnancy in first trimester TOC collected - Cervicovaginal ancillary only( [place])  8. Unwanted fertility Sign tubal papers at future appt  9. Rubella non-immune status, antepartum PP MMR  10. Thalassemia alpha carrier Will need partner testing  11. BMI 45.0-49.9, adult (HCC) 12. Antepartum multigravida of advanced maternal age ldASA Will get serial growth US/antenatal testing per MFM  13. Other abnormal cytological finding of specimen from cervix +HPV on 2 prior paps, pap this pregnancy NILM/HPV neg. Plan for pap in 1 year  Please refer to After Visit Summary for other counseling recommendations.   Return in about 4 weeks (around 02/23/2023) for return OB at 20 weeks.  Future Appointments  Date Time Provider  Department Center  02/15/2023  2:30 PM [facility] NURSE [facility]  02/15/2023  2:45 PM [facility] US4 [facility]  02/26/2023  1:30 PM Adam Phenix, MD [facility] None   Lennart Pall, MD

## 2023-01-28 LAB — AFP, SERUM, OPEN SPINA BIFIDA
AFP MoM: 0.89
AFP Value: 22.7 ng/mL
Gest. Age on Collection Date: 16.4 weeks
Maternal Age At EDD: 41.9 yr
OSBR Risk 1 IN: 10000
Test Results:: NEGATIVE
Weight: 322 [lb_av]

## 2023-01-29 LAB — CERVICOVAGINAL ANCILLARY ONLY
Chlamydia: NEGATIVE
Comment: NEGATIVE
Comment: NEGATIVE
Comment: NORMAL
Neisseria Gonorrhea: NEGATIVE
Trichomonas: NEGATIVE

## 2023-02-01 ENCOUNTER — Encounter: Payer: Self-pay | Admitting: Obstetrics and Gynecology

## 2023-02-02 ENCOUNTER — Ambulatory Visit (INDEPENDENT_AMBULATORY_CARE_PROVIDER_SITE_OTHER): Payer: Medicaid Other | Admitting: Cardiology

## 2023-02-02 ENCOUNTER — Encounter: Payer: Self-pay | Admitting: Cardiology

## 2023-02-02 VITALS — BP 160/100 | HR 58 | Ht 68.0 in | Wt 331.0 lb

## 2023-02-02 DIAGNOSIS — I10 Essential (primary) hypertension: Secondary | ICD-10-CM | POA: Diagnosis not present

## 2023-02-02 DIAGNOSIS — O09529 Supervision of elderly multigravida, unspecified trimester: Secondary | ICD-10-CM | POA: Diagnosis not present

## 2023-02-02 DIAGNOSIS — Z3A17 17 weeks gestation of pregnancy: Secondary | ICD-10-CM | POA: Diagnosis not present

## 2023-02-02 MED ORDER — LABETALOL HCL 200 MG PO TABS: 400.0000 mg | ORAL_TABLET | Freq: Two times a day (BID) | ORAL | 5 refills | Status: AC

## 2023-02-02 NOTE — Patient Instructions (Addendum)
Medication Instructions:     Increase Labetalol 400 mg twice a day   *If you need a refill on your cardiac medications before your next appointment, please call your pharmacy*   Lab Work:  CMET  Magnesium   If you have labs (blood work) drawn today and your tests are completely normal, you will receive your results only by: MyChart Message (if you have MyChart) OR A paper copy in the mail If you have any lab test that is abnormal or we need to change your treatment, we will call you to review the results.   Testing/Procedures: [address] Your physician has requested that you have an echocardiogram. Echocardiography is a painless test that uses sound waves to create images of your heart. It provides your doctor with information about the size and shape of your heart and how well your heart's chambers and valves are working. This procedure takes approximately one hour. There are no restrictions for this procedure. Please do NOT wear cologne, perfume, aftershave, or lotions (deodorant is allowed). Please arrive 15 minutes prior to your appointment time.    Follow-Up: At [facility], you and your health needs are our priority.  As part of our continuing mission to provide you with exceptional heart care, we have created designated Provider Care Teams.  These Care Teams include your primary Cardiologist (physician) and Advanced Practice Providers (APPs -  Physician Assistants and Nurse Practitioners) who all work together to provide you with the care you need, when you need it.     Your next appointment:    8 to 10 week(s)  northline   The format for your next appointment:   In Person  Provider:   Thomasene Ripple, DO   You have been referred to  [facility]  - 1 - 2 weeks  for b/p med optimization

## 2023-02-02 NOTE — Progress Notes (Unsigned)
Cardio-Obstetrics Clinic  New Evaluation  Date:  02/06/2023   ID:  Felicia Brennan, DOB March 21, 1981, MRN [id number]  PCP:  Patient, No Pcp Per   [facility] Providers Cardiologist:  Thomasene Ripple, DO  Electrophysiologist:  None       Referring MD: Lennart Pall, MD   Chief Complaint: " I was send due to high blood pressure"  History of Present Illness:    Felicia Brennan is a 42 y.o. female [G3P1011] who is being seen today for the evaluation of hypertension at the request of Lennart Pall, MD.   Medical history includes hypertension, obesity here today to be evaluated for chronic hypertension in pregnancy.  She does not have any complaints.  Prior CV Studies Reviewed: The following studies were reviewed today: None today   Past Medical History:  Diagnosis Date   Hypertension    Obese    Trichimoniasis 01/03/2018   tx 01/05/18   Vaginal Pap smear, abnormal     Past Surgical History:  Procedure Laterality Date   CESAREAN SECTION N/A 01/07/2018   Procedure: CESAREAN SECTION;  Surgeon: Deepstep Bing, MD;  Location: [facility] BIRTHING SUITES;  Service: Obstetrics;  Laterality: N/A;      OB History     Gravida  3   Para  1   Term  1   Preterm      AB  1   Living  1      SAB  1   IAB  0   Ectopic  0   Multiple  0   Live Births  1               Current Medications: Current Meds  Medication Sig   aspirin EC 81 MG tablet Take 1 tablet (81 mg total) by mouth daily. Take after 12 weeks for prevention of preeclampsia later in pregnancy   labetalol (NORMODYNE) 200 MG tablet Take 2 tablets (400 mg total) by mouth 2 (two) times daily.   NIFEdipine (PROCARDIA XL/NIFEDICAL XL) 60 MG 24 hr tablet Take 1 tablet (60 mg total) by mouth 2 (two) times daily.   Prenatal 28-0.8 MG TABS Take 1 tablet by mouth daily.   [DISCONTINUED] labetalol (NORMODYNE) 200 MG tablet Take 1 tablet (200 mg total) by mouth 2 (two) times daily.     Allergies:    Pistachio nut (diagnostic)   Social History   Socioeconomic History   Marital status: Single    Spouse name: Not on file   Number of children: Not on file   Years of education: Not on file   Highest education level: Not on file  Occupational History   Not on file  Tobacco Use   Smoking status: Some Days    Current packs/day: 0.25    Types: Cigarettes   Smokeless tobacco: Never  Vaping Use   Vaping status: Never Used  Substance and Sexual Activity   Alcohol use: Yes    Alcohol/week: 1.0 standard drink of alcohol    Types: 1 Glasses of wine per week   Drug use: Yes    Frequency: 2.0 times per week    Types: Marijuana   Sexual activity: Not Currently    Birth control/protection: None  Other Topics Concern   Not on file  Social History Narrative   Not on file   Social Determinants of Health   Financial Resource Strain: Not on file  Food Insecurity: Not on file  Transportation Needs: Not on file  Physical Activity:  Not on file  Stress: Not on file  Social Connections: Not on file      Family History  Problem Relation Age of Onset   Hypertension Mother    Pancreatic cancer Mother    Cancer Father    COPD Father    Lung cancer Father       ROS:   Please see the history of present illness.     All other systems reviewed and are negative.   Labs/EKG Reviewed:    EKG:   EKG  ordered today.  The ekg ordered today demonstrates sinus tachycardia, heart rate 1 1 bpm  Recent Labs: 01/08/2023: ALT 6; BUN 7; Creatinine, Ser 0.60; Hemoglobin 10.6; Platelets 246; Potassium 3.5; Sodium 139   Recent Lipid Panel Lab Results  Component Value Date/Time   CHOL 185 02/25/2018 04:06 PM   TRIG 127 02/25/2018 04:06 PM   HDL 51 02/25/2018 04:06 PM   CHOLHDL 3.6 02/25/2018 04:06 PM   LDLCALC 109 (H) 02/25/2018 04:06 PM    Physical Exam:    VS:  BP (!) 160/100 (BP Location: Right Arm)   Pulse (!) 58   Ht 5\' 8"  (1.727 m)   Wt (!) 331 lb (150.1 kg)   LMP 09/23/2022  (Approximate)   SpO2 98%   BMI 50.33 kg/m     Wt Readings from Last 3 Encounters:  02/02/23 (!) 331 lb (150.1 kg)  01/26/23 (!) 322 lb (146.1 kg)  01/08/23 (!) 320 lb 9.6 oz (145.4 kg)     GEN:  Well nourished, well developed in no acute distress HEENT: Normal NECK: No JVD; No carotid bruits LYMPHATICS: No lymphadenopathy CARDIAC: RRR, no murmurs, rubs, gallops RESPIRATORY:  Clear to auscultation without rales, wheezing or rhonchi  ABDOMEN: Soft, non-tender, non-distended MUSCULOSKELETAL:  No edema; No deformity  SKIN: Warm and dry NEUROLOGIC:  Alert and oriented x 3 PSYCHIATRIC:  Normal affect    Risk Assessment/Risk Calculators:                 ASSESSMENT & PLAN:    Chronic hypertension pregnancy Morbid obesity Advanced maternal age   She is significantly hypertensive in the office today - manually taken by me 160/100 mmhg. Increase her Labetalol to 400mg  twice a day. She will follow with our Cardio-Ob pharmacist for medication titration in 1-2 weeks. Continue current dose of her Nifedipine 60 mg twice a day.  Will order an echocardiogram.  Limit weight gain to 11- 20 pounds  Smoking cessation conversation intiated for now not ready to quit.  Patient Instructions  Medication Instructions:     Increase Labetalol 400 mg twice a day   *If you need a refill on your cardiac medications before your next appointment, please call your pharmacy*   Lab Work:  CMET  Magnesium   If you have labs (blood work) drawn today and your tests are completely normal, you will receive your results only by: MyChart Message (if you have MyChart) OR A paper copy in the mail If you have any lab test that is abnormal or we need to change your treatment, we will call you to review the results.   Testing/Procedures: [address] Your physician has requested that you have an echocardiogram. Echocardiography is a painless test that uses sound waves to  create images of your heart. It provides your doctor with information about the size and shape of your heart and how well your heart's chambers and valves are working. This procedure takes  approximately one hour. There are no restrictions for this procedure. Please do NOT wear cologne, perfume, aftershave, or lotions (deodorant is allowed). Please arrive 15 minutes prior to your appointment time.    Follow-Up: At [facility], you and your health needs are our priority.  As part of our continuing mission to provide you with exceptional heart care, we have created designated Provider Care Teams.  These Care Teams include your primary Cardiologist (physician) and Advanced Practice Providers (APPs -  Physician Assistants and Nurse Practitioners) who all work together to provide you with the care you need, when you need it.     Your next appointment:    8 to 10 week(s)  [facility]   The format for your next appointment:   In Person  Provider:   Thomasene Ripple, DO   You have been referred to  [facility]  - 1 - 2 weeks  for b/p med optimization     Dispo:  Return in about 10 weeks (around 04/13/2023).   Medication Adjustments/Labs and Tests Ordered: Current medicines are reviewed at length with the patient today.  Concerns regarding medicines are outlined above.  Tests Ordered: Orders Placed This Encounter  Procedures   Comprehensive metabolic panel   Magnesium   AMB Referral to [facility] Pharm[facility]   EKG 12-Lead   ECHOCARDIOGRAM COMPLETE   Medication Changes: Meds ordered this encounter  Medications   labetalol (NORMODYNE) 200 MG tablet    Sig: Take 2 tablets (400 mg total) by mouth 2 (two) times daily.    Dispense:  120 tablet    Refill:  5    Instruction changed and quantity

## 2023-02-12 ENCOUNTER — Ambulatory Visit: Payer: Medicaid Other | Attending: Cardiology

## 2023-02-12 NOTE — Progress Notes (Deleted)
Patient ID: Felicia Brennan                 DOB: [birth year]                      MRN: [id number]     HPI: Felicia Brennan is a 42 y.o. female referred by Dr. Marland Kitchen to HTN clinic. PMH is significant for  Current HTN meds:  Nifedipine 60mg  BID Labetalol 400mg  twice a day  Previously tried:  BP goal:   Family History:   Social History:   Diet:   Exercise:   Home BP readings:   Wt Readings from Last 3 Encounters:  02/02/23 (!) 331 lb (150.1 kg)  01/26/23 (!) 322 lb (146.1 kg)  01/08/23 (!) 320 lb 9.6 oz (145.4 kg)   BP Readings from Last 3 Encounters:  02/02/23 (!) 160/100  01/26/23 (!) 160/91  01/08/23 (!) 163/102   Pulse Readings from Last 3 Encounters:  02/02/23 (!) 58  01/26/23 80  01/08/23 (!) 105    Renal function: CrCl cannot be calculated (Patient's most recent lab result is older than the maximum 21 days allowed.).  Past Medical History:  Diagnosis Date   Hypertension    Obese    Trichimoniasis 01/03/2018   tx 01/05/18   Vaginal Pap smear, abnormal     Current Outpatient Medications on File Prior to Visit  Medication Sig Dispense Refill   aspirin EC 81 MG tablet Take 1 tablet (81 mg total) by mouth daily. Take after 12 weeks for prevention of preeclampsia later in pregnancy 300 tablet 2   labetalol (NORMODYNE) 200 MG tablet Take 2 tablets (400 mg total) by mouth 2 (two) times daily. 120 tablet 5   NIFEdipine (PROCARDIA XL/NIFEDICAL XL) 60 MG 24 hr tablet Take 1 tablet (60 mg total) by mouth 2 (two) times daily. 60 tablet 11   Prenatal 28-0.8 MG TABS Take 1 tablet by mouth daily. 30 tablet 12   No current facility-administered medications on file prior to visit.    Allergies  Allergen Reactions   Pistachio Nut (Diagnostic) Itching and Swelling    Throat itching and a little swelling     Assessment/Plan:  1. Hypertension -

## 2023-02-15 ENCOUNTER — Ambulatory Visit: Payer: Medicaid Other | Attending: Obstetrics & Gynecology

## 2023-02-15 ENCOUNTER — Ambulatory Visit: Payer: Medicaid Other | Admitting: *Deleted

## 2023-02-15 ENCOUNTER — Encounter: Payer: Self-pay | Admitting: *Deleted

## 2023-02-15 VITALS — BP 182/96 | HR 95

## 2023-02-15 DIAGNOSIS — I10 Essential (primary) hypertension: Secondary | ICD-10-CM | POA: Diagnosis not present

## 2023-02-15 DIAGNOSIS — O0990 Supervision of high risk pregnancy, unspecified, unspecified trimester: Secondary | ICD-10-CM | POA: Insufficient documentation

## 2023-02-16 ENCOUNTER — Other Ambulatory Visit: Payer: Self-pay | Admitting: *Deleted

## 2023-02-16 DIAGNOSIS — O09522 Supervision of elderly multigravida, second trimester: Secondary | ICD-10-CM

## 2023-02-16 DIAGNOSIS — Z362 Encounter for other antenatal screening follow-up: Secondary | ICD-10-CM

## 2023-02-16 DIAGNOSIS — O10912 Unspecified pre-existing hypertension complicating pregnancy, second trimester: Secondary | ICD-10-CM

## 2023-02-16 DIAGNOSIS — O99212 Obesity complicating pregnancy, second trimester: Secondary | ICD-10-CM

## 2023-02-26 ENCOUNTER — Ambulatory Visit (INDEPENDENT_AMBULATORY_CARE_PROVIDER_SITE_OTHER): Payer: Medicaid Other | Admitting: Obstetrics & Gynecology

## 2023-02-26 VITALS — BP 135/83 | HR 94 | Wt 331.0 lb

## 2023-02-26 DIAGNOSIS — O09529 Supervision of elderly multigravida, unspecified trimester: Secondary | ICD-10-CM

## 2023-02-26 DIAGNOSIS — Z98891 History of uterine scar from previous surgery: Secondary | ICD-10-CM

## 2023-02-26 DIAGNOSIS — O10919 Unspecified pre-existing hypertension complicating pregnancy, unspecified trimester: Secondary | ICD-10-CM

## 2023-02-26 DIAGNOSIS — Z6841 Body Mass Index (BMI) 40.0 and over, adult: Secondary | ICD-10-CM

## 2023-02-26 DIAGNOSIS — O0992 Supervision of high risk pregnancy, unspecified, second trimester: Secondary | ICD-10-CM

## 2023-02-26 DIAGNOSIS — O0990 Supervision of high risk pregnancy, unspecified, unspecified trimester: Secondary | ICD-10-CM

## 2023-02-26 DIAGNOSIS — O09522 Supervision of elderly multigravida, second trimester: Secondary | ICD-10-CM

## 2023-02-26 DIAGNOSIS — Z3A21 21 weeks gestation of pregnancy: Secondary | ICD-10-CM

## 2023-02-26 DIAGNOSIS — O10912 Unspecified pre-existing hypertension complicating pregnancy, second trimester: Secondary | ICD-10-CM

## 2023-02-26 NOTE — Progress Notes (Signed)
   PRENATAL VISIT NOTE  Subjective:  Felicia Brennan is a 42 y.o. G3P1011 at [redacted]w[redacted]d being seen today for ongoing prenatal care.  She is currently monitored for the following issues for this high-risk pregnancy and has Chronic hypertension during pregnancy, antepartum; Antepartum multigravida of advanced maternal age; Chlamydia & trichomonal vaginitis in pregnancy; Abnormal Pap smear of cervix; Status post primary low transverse cesarean section; Chronic hypertension; Marijuana smoker, episodic; Tobacco use disorder; Supervision of high risk pregnancy, antepartum; BMI 45.0-49.9, adult (HCC); History of HELLP syndrome, currently pregnant; Unwanted fertility; Rubella non-immune status, antepartum; and Thalassemia alpha carrier on their problem list.  Patient reports no complaints.  Contractions: Not present. Vag. Bleeding: None.  Movement: Present. Denies leaking of fluid.   The following portions of the patient's history were reviewed and updated as appropriate: allergies, current medications, past family history, past medical history, past social history, past surgical history and problem list.   Objective:   Vitals:   02/26/23 1336  BP: 135/83  Pulse: 94  Weight: (!) 331 lb (150.1 kg)    Fetal Status: Fetal Heart Rate (bpm): 150   Movement: Present     General:  Alert, oriented and cooperative. Patient is in no acute distress.  Skin: Skin is warm and dry. No rash noted.   Cardiovascular: Normal heart rate noted  Respiratory: Normal respiratory effort, no problems with respiration noted  Abdomen: Soft, gravid, appropriate for gestational age.  Pain/Pressure: Absent     Pelvic: Cervical exam deferred        Extremities: Normal range of motion.     Mental Status: Normal mood and affect. Normal behavior. Normal judgment and thought content.   Assessment and Plan:  Pregnancy: G3P1011 at [redacted]w[redacted]d 1. Supervision of high risk pregnancy, antepartum   2. Antepartum multigravida of advanced  maternal age   71. BMI 45.0-49.9, adult (HCC)   4. Status post primary low transverse cesarean section  CHTN, better control now Preterm labor symptoms and general obstetric precautions including but not limited to vaginal bleeding, contractions, leaking of fluid and fetal movement were reviewed in detail with the patient. Please refer to After Visit Summary for other counseling recommendations.   Return in about 4 weeks (around 03/26/2023).  Future Appointments  Date Time Provider Department Center  03/02/2023 11:15 AM [facility] ECHO 4 [facility]  03/13/2023  3:30 PM [facility] PHARMACIST [facility] None  03/14/2023  9:45 AM [facility] NURSE [facility]  03/14/2023 10:00 AM [facility] US1 [facility]  04/13/2023  4:00 PM Tobb, Kardie, DO [facility] None    Scheryl Darter, MD

## 2023-03-02 ENCOUNTER — Encounter (HOSPITAL_COMMUNITY): Payer: Self-pay | Admitting: Cardiology

## 2023-03-02 ENCOUNTER — Other Ambulatory Visit (HOSPITAL_COMMUNITY): Payer: Self-pay

## 2023-03-11 NOTE — Progress Notes (Unsigned)
Patient ID: Felicia Brennan                 DOB: [birth year]                      MRN: [id number]      HPI: Felicia Brennan is a 42 y.o. female [G3P1011] referred by Dr. Servando Salina to HTN clinic for chronic hypertension in pregnancy . PMH is significant for hypertension, obesity.  At last visit with Dr.Tobb her BP was elevated (160/100) labetalol was increased to 400 mg twice daily other BP medication she is on is nifedipine 60 mg twice daily.  G3 = She has been pregnant three times, including current pregnancy. P1011 = 1 full term pregnancies, 0 pre-term pregnancies, 1 abortion or miscarriage or ectopic or molar pregnancy, 1 living  Current HTN meds:  Previously tried:  BP goal:   Family History:   Social History:   Diet:   Exercise:  {types:28256}  Home BP readings:  Date SBP/DBP  HR              Average      Wt Readings from Last 3 Encounters:  02/26/23 (!) 331 lb (150.1 kg)  02/02/23 (!) 331 lb (150.1 kg)  01/26/23 (!) 322 lb (146.1 kg)   BP Readings from Last 3 Encounters:  02/26/23 135/83  02/15/23 (!) 182/96  02/02/23 (!) 160/100   Pulse Readings from Last 3 Encounters:  02/26/23 94  02/15/23 95  02/02/23 (!) 58    Renal function: CrCl cannot be calculated (Patient's most recent lab result is older than the maximum 21 days allowed.).  Past Medical History:  Diagnosis Date   Hypertension    Obese    Trichimoniasis 01/03/2018   tx 01/05/18   Vaginal Pap smear, abnormal     Current Outpatient Medications on File Prior to Visit  Medication Sig Dispense Refill   aspirin EC 81 MG tablet Take 1 tablet (81 mg total) by mouth daily. Take after 12 weeks for prevention of preeclampsia later in pregnancy 300 tablet 2   labetalol (NORMODYNE) 200 MG tablet Take 2 tablets (400 mg total) by mouth 2 (two) times daily. 120 tablet 5   NIFEdipine (PROCARDIA XL/NIFEDICAL XL) 60 MG 24 hr tablet Take 1 tablet (60 mg total) by mouth 2 (two) times daily. 60 tablet 11    Prenatal 28-0.8 MG TABS Take 1 tablet by mouth daily. 30 tablet 12   No current facility-administered medications on file prior to visit.    Allergies  Allergen Reactions   Pistachio Nut (Diagnostic) Itching and Swelling    Throat itching and a little swelling    Last menstrual period 09/23/2022, currently breastfeeding.   Assessment/Plan:  1. Hypertension -  No problem-specific Assessment & Plan notes found for this encounter.      Thank you  Carmela Hurt, Pharm.D [facility] Division of [facility] [address], [place], [place] [address]  Phone: [phone]; Fax: [phone]

## 2023-03-13 ENCOUNTER — Encounter: Payer: Self-pay | Admitting: Student

## 2023-03-13 ENCOUNTER — Ambulatory Visit: Payer: Medicaid Other | Attending: Cardiovascular Disease | Admitting: Student

## 2023-03-13 VITALS — BP 170/85 | HR 96

## 2023-03-13 DIAGNOSIS — O10919 Unspecified pre-existing hypertension complicating pregnancy, unspecified trimester: Secondary | ICD-10-CM | POA: Diagnosis not present

## 2023-03-13 DIAGNOSIS — Z3A21 21 weeks gestation of pregnancy: Secondary | ICD-10-CM | POA: Diagnosis not present

## 2023-03-13 MED ORDER — HYDRALAZINE HCL 10 MG PO TABS: 10.0000 mg | ORAL_TABLET | Freq: Three times a day (TID) | ORAL | 11 refills | Status: AC

## 2023-03-13 NOTE — Patient Instructions (Addendum)
Changes made by your pharmacist Carmela Hurt, PharmD at today's visit:    Instructions/Changes  (what do you need to do) Your Notes  (what you did and when you did it)  Continue taking labetalol 400 mg twice daily, nifedipine 60 mg twice daily       Reduce salt intake      Bring all of your meds, your BP cuff and your record of home blood pressures to your next appointment.    HOW TO TAKE YOUR BLOOD PRESSURE AT HOME  Rest 5 minutes before taking your blood pressure.  Don't smoke or drink caffeinated beverages for at least 30 minutes before. Take your blood pressure before (not after) you eat. Sit comfortably with your back supported and both feet on the floor (don't cross your legs). Elevate your arm to heart level on a table or a desk. Use the proper sized cuff. It should fit smoothly and snugly around your bare upper arm. There should be enough room to slip a fingertip under the cuff. The bottom edge of the cuff should be 1 inch above the crease of the elbow. Ideally, take 3 measurements at one sitting and record the average.  Important lifestyle changes to control high blood pressure  Intervention  Effect on the BP  Lose extra pounds and watch your waistline Weight loss is one of the most effective lifestyle changes for controlling blood pressure. If you're overweight or obese, losing even a small amount of weight can help reduce blood pressure. Blood pressure might go down by about 1 millimeter of mercury (mm Hg) with each kilogram (about 2.2 pounds) of weight lost.  Exercise regularly As a general goal, aim for at least 30 minutes of moderate physical activity every day. Regular physical activity can lower high blood pressure by about 5 to 8 mm Hg.  Eat a healthy diet Eating a diet rich in whole grains, fruits, vegetables, and low-fat dairy products and low in saturated fat and cholesterol. A healthy diet can lower high blood pressure by up to 11 mm Hg.  Reduce salt (sodium) in  your diet Even a small reduction of sodium in the diet can improve heart health and reduce high blood pressure by about 5 to 6 mm Hg.  Limit alcohol One drink equals 12 ounces of beer, 5 ounces of wine, or 1.5 ounces of 80-proof liquor.  Limiting alcohol to less than one drink a day for women or two drinks a day for men can help lower blood pressure by about 4 mm Hg.   If you have any questions or concerns please use My Chart to send questions or call the office at [phone]

## 2023-03-13 NOTE — Assessment & Plan Note (Addendum)
Assessment: BP is uncontrolled in office BP 163/97 2nd measurement 170/85(goal <130/80) On 800 mg /day labetalol and 120 mg/day nifedipine  Takes current BP medications regularly and Tolerates them well without any side effects Given uncontrolled BP on 1st line agent reasonable to add direct vasodilator  Denies SOB, palpitation, chest pain, headaches,or swelling Does not watch salt intake and does not get time to do exercise  Smoking: 4-5 cig per day does not want to quit    Plan:  Start taking hydralazine 10 mg three times daily   Continue taking  labetalol 400 mg twice daily, nifedipine 60 mg twice daily  Patient to keep record of BP readings with heart rate and report to [place] at the next visit Patient to bring home BP monitor for validation at next OV Reiterated importance of reducing slat intake,smoking cession and regular exercise  Patient to see PharmD in 4 weeks for follow up  Follow up lab(s): none

## 2023-03-14 ENCOUNTER — Ambulatory Visit: Payer: Medicaid Other | Admitting: *Deleted

## 2023-03-14 ENCOUNTER — Other Ambulatory Visit: Payer: Self-pay | Admitting: *Deleted

## 2023-03-14 ENCOUNTER — Ambulatory Visit: Payer: Medicaid Other | Attending: Obstetrics

## 2023-03-14 VITALS — BP 142/74 | HR 87

## 2023-03-14 DIAGNOSIS — O99212 Obesity complicating pregnancy, second trimester: Secondary | ICD-10-CM | POA: Insufficient documentation

## 2023-03-14 DIAGNOSIS — O99332 Smoking (tobacco) complicating pregnancy, second trimester: Secondary | ICD-10-CM | POA: Diagnosis not present

## 2023-03-14 DIAGNOSIS — Z3A23 23 weeks gestation of pregnancy: Secondary | ICD-10-CM

## 2023-03-14 DIAGNOSIS — O09292 Supervision of pregnancy with other poor reproductive or obstetric history, second trimester: Secondary | ICD-10-CM

## 2023-03-14 DIAGNOSIS — F17210 Nicotine dependence, cigarettes, uncomplicated: Secondary | ICD-10-CM | POA: Diagnosis not present

## 2023-03-14 DIAGNOSIS — O0990 Supervision of high risk pregnancy, unspecified, unspecified trimester: Secondary | ICD-10-CM

## 2023-03-14 DIAGNOSIS — D569 Thalassemia, unspecified: Secondary | ICD-10-CM | POA: Diagnosis not present

## 2023-03-14 DIAGNOSIS — O99012 Anemia complicating pregnancy, second trimester: Secondary | ICD-10-CM | POA: Diagnosis not present

## 2023-03-14 DIAGNOSIS — O09522 Supervision of elderly multigravida, second trimester: Secondary | ICD-10-CM | POA: Insufficient documentation

## 2023-03-14 DIAGNOSIS — E669 Obesity, unspecified: Secondary | ICD-10-CM

## 2023-03-14 DIAGNOSIS — O10012 Pre-existing essential hypertension complicating pregnancy, second trimester: Secondary | ICD-10-CM | POA: Diagnosis not present

## 2023-03-14 DIAGNOSIS — O10912 Unspecified pre-existing hypertension complicating pregnancy, second trimester: Secondary | ICD-10-CM

## 2023-03-14 DIAGNOSIS — Z362 Encounter for other antenatal screening follow-up: Secondary | ICD-10-CM | POA: Insufficient documentation

## 2023-03-28 ENCOUNTER — Ambulatory Visit (INDEPENDENT_AMBULATORY_CARE_PROVIDER_SITE_OTHER): Payer: Medicaid Other | Admitting: Obstetrics and Gynecology

## 2023-03-28 VITALS — BP 121/85 | HR 95 | Wt 337.0 lb

## 2023-03-28 DIAGNOSIS — Z98891 History of uterine scar from previous surgery: Secondary | ICD-10-CM | POA: Diagnosis not present

## 2023-03-28 DIAGNOSIS — O0992 Supervision of high risk pregnancy, unspecified, second trimester: Secondary | ICD-10-CM | POA: Diagnosis not present

## 2023-03-28 DIAGNOSIS — O09892 Supervision of other high risk pregnancies, second trimester: Secondary | ICD-10-CM | POA: Diagnosis not present

## 2023-03-28 DIAGNOSIS — Z3A25 25 weeks gestation of pregnancy: Secondary | ICD-10-CM | POA: Diagnosis not present

## 2023-03-28 DIAGNOSIS — O0990 Supervision of high risk pregnancy, unspecified, unspecified trimester: Secondary | ICD-10-CM

## 2023-03-28 DIAGNOSIS — O10912 Unspecified pre-existing hypertension complicating pregnancy, second trimester: Secondary | ICD-10-CM | POA: Diagnosis not present

## 2023-03-28 DIAGNOSIS — O09292 Supervision of pregnancy with other poor reproductive or obstetric history, second trimester: Secondary | ICD-10-CM | POA: Diagnosis not present

## 2023-03-28 DIAGNOSIS — D563 Thalassemia minor: Secondary | ICD-10-CM

## 2023-03-28 DIAGNOSIS — Z6841 Body Mass Index (BMI) 40.0 and over, adult: Secondary | ICD-10-CM | POA: Diagnosis not present

## 2023-03-28 DIAGNOSIS — O09522 Supervision of elderly multigravida, second trimester: Secondary | ICD-10-CM | POA: Diagnosis not present

## 2023-03-28 DIAGNOSIS — O09899 Supervision of other high risk pregnancies, unspecified trimester: Secondary | ICD-10-CM

## 2023-03-28 DIAGNOSIS — Z2839 Other underimmunization status: Secondary | ICD-10-CM

## 2023-03-28 DIAGNOSIS — O09299 Supervision of pregnancy with other poor reproductive or obstetric history, unspecified trimester: Secondary | ICD-10-CM

## 2023-03-28 DIAGNOSIS — O10919 Unspecified pre-existing hypertension complicating pregnancy, unspecified trimester: Secondary | ICD-10-CM

## 2023-03-28 DIAGNOSIS — O09529 Supervision of elderly multigravida, unspecified trimester: Secondary | ICD-10-CM

## 2023-03-28 NOTE — Progress Notes (Signed)
Pt saw Cardiology on 8/27 and u/s on 8/28.

## 2023-03-28 NOTE — Progress Notes (Signed)
   PRENATAL VISIT NOTE  Subjective:  Felicia Brennan is a 42 y.o. G3P1011 at [redacted]w[redacted]d being seen today for ongoing prenatal care.  She is currently monitored for the following issues for this high-risk pregnancy and has Chronic hypertension during pregnancy, antepartum; Antepartum multigravida of advanced maternal age; Chlamydia & trichomonal vaginitis in pregnancy; Abnormal Pap smear of cervix; Status post primary low transverse cesarean section; Chronic hypertension; Marijuana smoker, episodic; Tobacco use disorder; Supervision of high risk pregnancy, antepartum; BMI 50.0-59.9, adult (HCC); History of HELLP syndrome, currently pregnant; Unwanted fertility; Rubella non-immune status, antepartum; and Thalassemia alpha carrier on their problem list.  Patient doing well with no acute concerns today. She reports no complaints.  Contractions: Not present. Vag. Bleeding: None.  Movement: Present. Denies leaking of fluid.   Pt is experiencing some housing instability currently.  The following portions of the patient's history were reviewed and updated as appropriate: allergies, current medications, past family history, past medical history, past social history, past surgical history and problem list. Problem list updated.  Objective:   Vitals:   03/28/23 1355  BP: 121/85  Pulse: 95  Weight: (!) 337 lb (152.9 kg)    Fetal Status: Fetal Heart Rate (bpm): 145 Fundal Height: 26 cm Movement: Present     General:  Alert, oriented and cooperative. Patient is in no acute distress.  Skin: Skin is warm and dry. No rash noted.   Cardiovascular: Normal heart rate noted  Respiratory: Normal respiratory effort, no problems with respiration noted  Abdomen: Soft, gravid, appropriate for gestational age.  Pain/Pressure: Absent     Pelvic: Cervical exam deferred        Extremities: Normal range of motion.     Mental Status:  Normal mood and affect. Normal behavior. Normal judgment and thought content.    Assessment and Plan:  Pregnancy: G3P1011 at [redacted]w[redacted]d  1. [redacted] weeks gestation of pregnancy   2. Chronic hypertension during pregnancy, antepartum Pt is compliant with medications. BP well controlled currently  3. Antepartum multigravida of advanced maternal age   15. BMI 50.0-59.9, adult (HCC)   5. History of HELLP syndrome, currently pregnant No s/sx of preeclampsia  6. Rubella non-immune status, antepartum Treat after delivery  7. Status post primary low transverse cesarean section Discuss route of delivery at "28 week" visit Pt will also need BTL forms signed at that time  8. Supervision of high risk pregnancy, antepartum Continue routine prenatal care  9. Thalassemia alpha carrier   Preterm labor symptoms and general obstetric precautions including but not limited to vaginal bleeding, contractions, leaking of fluid and fetal movement were reviewed in detail with the patient.  Please refer to After Visit Summary for other counseling recommendations.   Return in about 2 weeks (around 04/11/2023) for [facility], in person, 2 hr GTT, 3rd trim labs.   Mariel Aloe, MD Faculty Attending Center for [facility]

## 2023-04-08 NOTE — Progress Notes (Unsigned)
   PRENATAL VISIT NOTE  Subjective:  Felicia Brennan is a 42 y.o. G3P1011 at [redacted]w[redacted]d being seen today for ongoing prenatal care.  She is currently monitored for the following issues for this high-risk pregnancy and has Chronic hypertension during pregnancy, antepartum; Antepartum multigravida of advanced maternal age; Chlamydia & trichomonal vaginitis in pregnancy; Abnormal Pap smear of cervix; Status post primary low transverse cesarean section; Chronic hypertension; Marijuana smoker, episodic; Tobacco use disorder; Supervision of high risk pregnancy, antepartum; BMI 50.0-59.9, adult (HCC); History of HELLP syndrome, currently pregnant; Unwanted fertility; Rubella non-immune status, antepartum; and Thalassemia alpha carrier on their problem list.  Patient reports {sx:14538}.   .  .   . Denies leaking of fluid.   The following portions of the patient's history were reviewed and updated as appropriate: allergies, current medications, past family history, past medical history, past social history, past surgical history and problem list.   Objective:  There were no vitals filed for this visit.  Fetal Status:           General:  Alert, oriented and cooperative. Patient is in no acute distress.  Skin: Skin is warm and dry. No rash noted.   Cardiovascular: Normal heart rate noted  Respiratory: Normal respiratory effort, no problems with respiration noted  Abdomen: Soft, gravid, appropriate for gestational age.        Pelvic: {Blank single:19197::"Cervical exam performed in the presence of a chaperone","Cervical exam deferred"}        Extremities: Normal range of motion.     Mental Status: Normal mood and affect. Normal behavior. Normal judgment and thought content.   Assessment and Plan:  Pregnancy: G3P1011 at [redacted]w[redacted]d 1. Supervision of high risk pregnancy, antepartum ***  2. [redacted] weeks gestation of pregnancy ***  3. Chronic hypertension during pregnancy, antepartum - Continue bASA,  hydralazone, labetalol, and nifedipine therapy - Scheduled echocardiogram -BPP and growth scan is scheduled  4. Antepartum multigravida of advanced maternal age - repeat growth scan scheduled - low risk NIPS  5. BMI 50.0-59.9, adult (HCC) - repeat growth scheduled  6. Rubella non-immune status, antepartum - recommend MMR postpartum  7. Thalassemia alpha carrier ***  8. History of HELLP syndrome, currently pregnant ***  9. History of C-section - Discuss route of delivery at next visit - BTL forms signed, needs MD counseling   Preterm labor symptoms and general obstetric precautions including but not limited to vaginal bleeding, contractions, leaking of fluid and fetal movement were reviewed in detail with the patient. Please refer to After Visit Summary for other counseling recommendations.   No follow-ups on file.  Future Appointments  Date Time Provider Department Center  04/09/2023  8:35 AM [facility] LAB [facility] None  04/09/2023  8:55 AM Corlis Hove, NP [facility] None  04/11/2023 12:15 PM [facility] NURSE [facility]  04/11/2023 12:30 PM [facility] US3 [facility]  04/13/2023  3:30 PM [facility] PHARMACIST [facility] None  04/13/2023  4:00 PM Tobb, Kardie, DO [facility] None  04/24/2023  1:30 PM Hessie Dibble, MD [facility] None  05/08/2023  1:30 PM Hurshel Party, CNM [facility] None  05/09/2023 12:15 PM [facility] NURSE [facility]  05/09/2023 12:30 PM [facility] US3 [facility]    Corlis Hove, NP

## 2023-04-09 ENCOUNTER — Ambulatory Visit (INDEPENDENT_AMBULATORY_CARE_PROVIDER_SITE_OTHER): Payer: Medicaid Other | Admitting: Student

## 2023-04-09 ENCOUNTER — Other Ambulatory Visit: Payer: Medicaid Other

## 2023-04-09 VITALS — BP 126/72 | HR 85 | Wt 348.8 lb

## 2023-04-09 DIAGNOSIS — Z98891 History of uterine scar from previous surgery: Secondary | ICD-10-CM

## 2023-04-09 DIAGNOSIS — O10919 Unspecified pre-existing hypertension complicating pregnancy, unspecified trimester: Secondary | ICD-10-CM

## 2023-04-09 DIAGNOSIS — Z3A27 27 weeks gestation of pregnancy: Secondary | ICD-10-CM

## 2023-04-09 DIAGNOSIS — O0990 Supervision of high risk pregnancy, unspecified, unspecified trimester: Secondary | ICD-10-CM | POA: Diagnosis not present

## 2023-04-09 DIAGNOSIS — Z2839 Other underimmunization status: Secondary | ICD-10-CM

## 2023-04-09 DIAGNOSIS — O09899 Supervision of other high risk pregnancies, unspecified trimester: Secondary | ICD-10-CM

## 2023-04-09 DIAGNOSIS — O09529 Supervision of elderly multigravida, unspecified trimester: Secondary | ICD-10-CM

## 2023-04-09 DIAGNOSIS — O09299 Supervision of pregnancy with other poor reproductive or obstetric history, unspecified trimester: Secondary | ICD-10-CM

## 2023-04-09 DIAGNOSIS — Z6841 Body Mass Index (BMI) 40.0 and over, adult: Secondary | ICD-10-CM

## 2023-04-09 DIAGNOSIS — D563 Thalassemia minor: Secondary | ICD-10-CM

## 2023-04-09 NOTE — Progress Notes (Signed)
Pt reports fetal movement, denies pain.  Declined tdap today

## 2023-04-10 LAB — COMPREHENSIVE METABOLIC PANEL
ALT: 10 IU/L (ref 0–32)
AST: 12 IU/L (ref 0–40)
Albumin: 3.5 g/dL — ABNORMAL LOW (ref 3.9–4.9)
Alkaline Phosphatase: 65 IU/L (ref 44–121)
BUN/Creatinine Ratio: 18 (ref 9–23)
BUN: 9 mg/dL (ref 6–24)
Bilirubin Total: <0.2 mg/dL (ref 0.0–1.2)
CO2: 18 mmol/L — ABNORMAL LOW (ref 20–29)
Calcium: 9 mg/dL (ref 8.7–10.2)
Chloride: 104 mmol/L (ref 96–106)
Creatinine, Ser: 0.5 mg/dL — ABNORMAL LOW (ref 0.57–1.00)
Globulin, Total: 2.8 g/dL (ref 1.5–4.5)
Glucose: 87 mg/dL (ref 70–99)
Potassium: 3.8 mmol/L (ref 3.5–5.2)
Sodium: 137 mmol/L (ref 134–144)
Total Protein: 6.3 g/dL (ref 6.0–8.5)
eGFR: 121 mL/min/{1.73_m2} (ref >59)

## 2023-04-10 LAB — GLUCOSE TOLERANCE, 2 HOURS W/ 1HR
Glucose, 1 hour: 131 mg/dL (ref 70–179)
Glucose, 2 hour: 86 mg/dL (ref 70–152)
Glucose, Fasting: 90 mg/dL (ref 70–91)

## 2023-04-10 LAB — HIV ANTIBODY (ROUTINE TESTING W REFLEX): HIV Screen 4th Generation wRfx: NONREACTIVE

## 2023-04-10 LAB — RPR: RPR Ser Ql: NONREACTIVE

## 2023-04-10 LAB — CBC
Hematocrit: 30.6 % — ABNORMAL LOW (ref 34.0–46.6)
Hemoglobin: 10.2 g/dL — ABNORMAL LOW (ref 11.1–15.9)
MCH: 30.4 pg (ref 26.6–33.0)
MCHC: 33.3 g/dL (ref 31.5–35.7)
MCV: 91 fL (ref 79–97)
Platelets: 239 10*3/uL (ref 150–450)
RBC: 3.35 x10E6/uL — ABNORMAL LOW (ref 3.77–5.28)
RDW: 10.9 % — ABNORMAL LOW (ref 11.7–15.4)
WBC: 9.2 10*3/uL (ref 3.4–10.8)

## 2023-04-11 ENCOUNTER — Ambulatory Visit: Payer: Medicaid Other | Attending: Obstetrics and Gynecology

## 2023-04-11 ENCOUNTER — Ambulatory Visit: Payer: Medicaid Other

## 2023-04-11 DIAGNOSIS — O09522 Supervision of elderly multigravida, second trimester: Secondary | ICD-10-CM

## 2023-04-11 DIAGNOSIS — Z3A27 27 weeks gestation of pregnancy: Secondary | ICD-10-CM

## 2023-04-11 DIAGNOSIS — O10012 Pre-existing essential hypertension complicating pregnancy, second trimester: Secondary | ICD-10-CM | POA: Diagnosis not present

## 2023-04-11 DIAGNOSIS — F17210 Nicotine dependence, cigarettes, uncomplicated: Secondary | ICD-10-CM | POA: Diagnosis not present

## 2023-04-11 DIAGNOSIS — O99012 Anemia complicating pregnancy, second trimester: Secondary | ICD-10-CM

## 2023-04-11 DIAGNOSIS — O99332 Smoking (tobacco) complicating pregnancy, second trimester: Secondary | ICD-10-CM | POA: Diagnosis not present

## 2023-04-11 DIAGNOSIS — E669 Obesity, unspecified: Secondary | ICD-10-CM

## 2023-04-11 DIAGNOSIS — D569 Thalassemia, unspecified: Secondary | ICD-10-CM | POA: Diagnosis not present

## 2023-04-11 DIAGNOSIS — O10912 Unspecified pre-existing hypertension complicating pregnancy, second trimester: Secondary | ICD-10-CM | POA: Insufficient documentation

## 2023-04-11 DIAGNOSIS — O09292 Supervision of pregnancy with other poor reproductive or obstetric history, second trimester: Secondary | ICD-10-CM | POA: Diagnosis not present

## 2023-04-11 DIAGNOSIS — O99212 Obesity complicating pregnancy, second trimester: Secondary | ICD-10-CM

## 2023-04-11 LAB — PROTEIN / CREATININE RATIO, URINE
Creatinine, Urine: 199.9 mg/dL
Protein, Ur: 27.7 mg/dL
Protein/Creat Ratio: 139 mg/g creat (ref 0–200)

## 2023-04-13 ENCOUNTER — Ambulatory Visit: Payer: Medicaid Other | Attending: Cardiology | Admitting: Cardiology

## 2023-04-13 ENCOUNTER — Ambulatory Visit: Payer: Medicaid Other

## 2023-04-15 ENCOUNTER — Other Ambulatory Visit: Payer: Self-pay | Admitting: Student

## 2023-04-15 DIAGNOSIS — O99013 Anemia complicating pregnancy, third trimester: Secondary | ICD-10-CM

## 2023-04-15 MED ORDER — FERROUS SULFATE 325 (65 FE) MG PO TBEC
325.0000 mg | DELAYED_RELEASE_TABLET | Freq: Every day | ORAL | 3 refills | Status: AC
Start: 2023-04-15 — End: ?

## 2023-04-16 ENCOUNTER — Encounter: Payer: Self-pay | Admitting: Cardiology

## 2023-04-24 ENCOUNTER — Encounter: Payer: Medicaid Other | Admitting: Family Medicine

## 2023-05-07 ENCOUNTER — Telehealth: Payer: Self-pay | Admitting: Cardiology

## 2023-05-07 NOTE — Telephone Encounter (Signed)
Left voicemail 05/07/23 @ 11:06am to verify address and phone number :returned mail. Alvino Chapel

## 2023-05-08 ENCOUNTER — Encounter: Payer: Medicaid Other | Admitting: Advanced Practice Midwife

## 2023-05-09 ENCOUNTER — Ambulatory Visit: Payer: Medicaid Other

## 2023-05-09 ENCOUNTER — Ambulatory Visit: Payer: Medicaid Other | Attending: Obstetrics and Gynecology

## 2023-05-22 ENCOUNTER — Encounter: Payer: Medicaid Other | Admitting: Advanced Practice Midwife

## 2023-06-05 ENCOUNTER — Encounter: Payer: Medicaid Other | Admitting: Advanced Practice Midwife
